# Patient Record
Sex: Male | Born: 1935 | Race: White | Hispanic: No | Smoking: Never smoker
Health system: Southern US, Community
[De-identification: ages and names within clinical notes are randomized; demographics above are authoritative.]

## PROBLEM LIST (undated history)

## (undated) DIAGNOSIS — I350 Nonrheumatic aortic (valve) stenosis: Secondary | ICD-10-CM

## (undated) DIAGNOSIS — M1990 Unspecified osteoarthritis, unspecified site: Secondary | ICD-10-CM

## (undated) DIAGNOSIS — E46 Unspecified protein-calorie malnutrition: Secondary | ICD-10-CM

## (undated) DIAGNOSIS — I779 Disorder of arteries and arterioles, unspecified: Secondary | ICD-10-CM

## (undated) DIAGNOSIS — N400 Enlarged prostate without lower urinary tract symptoms: Secondary | ICD-10-CM

## (undated) DIAGNOSIS — K449 Diaphragmatic hernia without obstruction or gangrene: Secondary | ICD-10-CM

## (undated) DIAGNOSIS — I639 Cerebral infarction, unspecified: Secondary | ICD-10-CM

## (undated) DIAGNOSIS — I272 Pulmonary hypertension, unspecified: Secondary | ICD-10-CM

## (undated) DIAGNOSIS — C8590 Non-Hodgkin lymphoma, unspecified, unspecified site: Secondary | ICD-10-CM

## (undated) DIAGNOSIS — I2510 Atherosclerotic heart disease of native coronary artery without angina pectoris: Secondary | ICD-10-CM

## (undated) DIAGNOSIS — I10 Essential (primary) hypertension: Secondary | ICD-10-CM

## (undated) DIAGNOSIS — I739 Peripheral vascular disease, unspecified: Secondary | ICD-10-CM

## (undated) HISTORY — DX: Non-Hodgkin lymphoma, unspecified, unspecified site: C85.90

## (undated) HISTORY — DX: Pulmonary hypertension, unspecified: I27.20

## (undated) HISTORY — DX: Atherosclerotic heart disease of native coronary artery without angina pectoris: I25.10

## (undated) HISTORY — DX: Cerebral infarction, unspecified: I63.9

## (undated) HISTORY — DX: Diaphragmatic hernia without obstruction or gangrene: K44.9

## (undated) HISTORY — PX: INGUINAL HERNIA REPAIR: SUR1180

## (undated) HISTORY — DX: Benign prostatic hyperplasia without lower urinary tract symptoms: N40.0

## (undated) HISTORY — DX: Nonrheumatic aortic (valve) stenosis: I35.0

## (undated) HISTORY — DX: Peripheral vascular disease, unspecified: I73.9

## (undated) HISTORY — DX: Unspecified protein-calorie malnutrition: E46

## (undated) HISTORY — PX: CATARACT EXTRACTION: SUR2

## (undated) HISTORY — DX: Essential (primary) hypertension: I10

## (undated) HISTORY — DX: Disorder of arteries and arterioles, unspecified: I77.9

## (undated) HISTORY — DX: Unspecified osteoarthritis, unspecified site: M19.90

---

## 2008-12-25 DEATH — deceased

## 2011-04-17 DIAGNOSIS — C8299 Follicular lymphoma, unspecified, extranodal and solid organ sites: Secondary | ICD-10-CM | POA: Diagnosis not present

## 2011-04-17 DIAGNOSIS — R1310 Dysphagia, unspecified: Secondary | ICD-10-CM | POA: Diagnosis not present

## 2011-04-17 DIAGNOSIS — R05 Cough: Secondary | ICD-10-CM | POA: Diagnosis not present

## 2011-08-22 ENCOUNTER — Encounter (INDEPENDENT_AMBULATORY_CARE_PROVIDER_SITE_OTHER): Payer: No Typology Code available for payment source | Admitting: Internal Medicine

## 2011-08-22 DIAGNOSIS — C8589 Other specified types of non-Hodgkin lymphoma, extranodal and solid organ sites: Secondary | ICD-10-CM

## 2011-08-22 DIAGNOSIS — R0602 Shortness of breath: Secondary | ICD-10-CM

## 2011-08-27 DIAGNOSIS — C8589 Other specified types of non-Hodgkin lymphoma, extranodal and solid organ sites: Secondary | ICD-10-CM | POA: Diagnosis not present

## 2011-08-27 DIAGNOSIS — R911 Solitary pulmonary nodule: Secondary | ICD-10-CM | POA: Diagnosis not present

## 2011-09-03 ENCOUNTER — Encounter: Payer: No Typology Code available for payment source | Admitting: Internal Medicine

## 2011-09-03 DIAGNOSIS — C8299 Follicular lymphoma, unspecified, extranodal and solid organ sites: Secondary | ICD-10-CM

## 2011-09-03 DIAGNOSIS — Z5111 Encounter for antineoplastic chemotherapy: Secondary | ICD-10-CM

## 2011-09-03 DIAGNOSIS — Z5112 Encounter for antineoplastic immunotherapy: Secondary | ICD-10-CM

## 2011-09-04 ENCOUNTER — Encounter: Payer: No Typology Code available for payment source | Admitting: Internal Medicine

## 2011-09-04 DIAGNOSIS — C8299 Follicular lymphoma, unspecified, extranodal and solid organ sites: Secondary | ICD-10-CM

## 2011-09-04 DIAGNOSIS — Z5111 Encounter for antineoplastic chemotherapy: Secondary | ICD-10-CM

## 2011-09-10 ENCOUNTER — Encounter: Payer: No Typology Code available for payment source | Admitting: Internal Medicine

## 2011-09-10 DIAGNOSIS — J479 Bronchiectasis, uncomplicated: Secondary | ICD-10-CM

## 2011-09-10 DIAGNOSIS — R0602 Shortness of breath: Secondary | ICD-10-CM

## 2011-09-10 DIAGNOSIS — C8589 Other specified types of non-Hodgkin lymphoma, extranodal and solid organ sites: Secondary | ICD-10-CM

## 2011-10-01 ENCOUNTER — Encounter: Payer: No Typology Code available for payment source | Admitting: Hematology and Oncology

## 2011-10-01 DIAGNOSIS — Z5111 Encounter for antineoplastic chemotherapy: Secondary | ICD-10-CM

## 2011-10-01 DIAGNOSIS — C8299 Follicular lymphoma, unspecified, extranodal and solid organ sites: Secondary | ICD-10-CM

## 2011-10-01 DIAGNOSIS — Z5112 Encounter for antineoplastic immunotherapy: Secondary | ICD-10-CM

## 2011-10-02 DIAGNOSIS — Z5111 Encounter for antineoplastic chemotherapy: Secondary | ICD-10-CM

## 2011-10-02 DIAGNOSIS — C8299 Follicular lymphoma, unspecified, extranodal and solid organ sites: Secondary | ICD-10-CM

## 2011-10-10 ENCOUNTER — Encounter: Payer: No Typology Code available for payment source | Admitting: Internal Medicine

## 2011-10-10 DIAGNOSIS — C8589 Other specified types of non-Hodgkin lymphoma, extranodal and solid organ sites: Secondary | ICD-10-CM

## 2011-10-10 DIAGNOSIS — M549 Dorsalgia, unspecified: Secondary | ICD-10-CM

## 2011-10-30 DIAGNOSIS — M549 Dorsalgia, unspecified: Secondary | ICD-10-CM

## 2011-10-30 DIAGNOSIS — J449 Chronic obstructive pulmonary disease, unspecified: Secondary | ICD-10-CM

## 2011-10-30 DIAGNOSIS — C8299 Follicular lymphoma, unspecified, extranodal and solid organ sites: Secondary | ICD-10-CM

## 2011-10-30 DIAGNOSIS — G589 Mononeuropathy, unspecified: Secondary | ICD-10-CM

## 2011-11-05 ENCOUNTER — Encounter: Payer: No Typology Code available for payment source | Admitting: Internal Medicine

## 2011-11-07 DIAGNOSIS — G893 Neoplasm related pain (acute) (chronic): Secondary | ICD-10-CM

## 2011-11-07 DIAGNOSIS — C8589 Other specified types of non-Hodgkin lymphoma, extranodal and solid organ sites: Secondary | ICD-10-CM

## 2011-11-18 ENCOUNTER — Encounter (INDEPENDENT_AMBULATORY_CARE_PROVIDER_SITE_OTHER): Payer: No Typology Code available for payment source | Admitting: Internal Medicine

## 2011-11-18 DIAGNOSIS — C8589 Other specified types of non-Hodgkin lymphoma, extranodal and solid organ sites: Secondary | ICD-10-CM

## 2011-11-18 DIAGNOSIS — R1310 Dysphagia, unspecified: Secondary | ICD-10-CM

## 2011-12-09 ENCOUNTER — Encounter (INDEPENDENT_AMBULATORY_CARE_PROVIDER_SITE_OTHER): Payer: No Typology Code available for payment source

## 2011-12-09 DIAGNOSIS — C8589 Other specified types of non-Hodgkin lymphoma, extranodal and solid organ sites: Secondary | ICD-10-CM

## 2011-12-09 DIAGNOSIS — Z23 Encounter for immunization: Secondary | ICD-10-CM

## 2011-12-09 DIAGNOSIS — R1310 Dysphagia, unspecified: Secondary | ICD-10-CM

## 2011-12-25 ENCOUNTER — Encounter: Payer: No Typology Code available for payment source | Admitting: Internal Medicine

## 2011-12-25 DIAGNOSIS — N400 Enlarged prostate without lower urinary tract symptoms: Secondary | ICD-10-CM

## 2011-12-25 DIAGNOSIS — R1310 Dysphagia, unspecified: Secondary | ICD-10-CM

## 2011-12-25 DIAGNOSIS — C8589 Other specified types of non-Hodgkin lymphoma, extranodal and solid organ sites: Secondary | ICD-10-CM

## 2011-12-25 DIAGNOSIS — M549 Dorsalgia, unspecified: Secondary | ICD-10-CM

## 2012-02-10 DIAGNOSIS — C8589 Other specified types of non-Hodgkin lymphoma, extranodal and solid organ sites: Secondary | ICD-10-CM

## 2012-02-10 DIAGNOSIS — R109 Unspecified abdominal pain: Secondary | ICD-10-CM

## 2012-02-10 DIAGNOSIS — M549 Dorsalgia, unspecified: Secondary | ICD-10-CM

## 2012-02-10 DIAGNOSIS — K5900 Constipation, unspecified: Secondary | ICD-10-CM

## 2012-02-23 DIAGNOSIS — C8589 Other specified types of non-Hodgkin lymphoma, extranodal and solid organ sites: Secondary | ICD-10-CM

## 2012-02-23 DIAGNOSIS — K5900 Constipation, unspecified: Secondary | ICD-10-CM

## 2012-02-26 ENCOUNTER — Encounter: Payer: No Typology Code available for payment source | Admitting: Internal Medicine

## 2012-02-26 DIAGNOSIS — G8929 Other chronic pain: Secondary | ICD-10-CM

## 2012-02-26 DIAGNOSIS — C8299 Follicular lymphoma, unspecified, extranodal and solid organ sites: Secondary | ICD-10-CM

## 2012-03-03 DIAGNOSIS — R1310 Dysphagia, unspecified: Secondary | ICD-10-CM | POA: Diagnosis not present

## 2012-03-03 DIAGNOSIS — IMO0001 Reserved for inherently not codable concepts without codable children: Secondary | ICD-10-CM | POA: Diagnosis not present

## 2012-03-03 DIAGNOSIS — K224 Dyskinesia of esophagus: Secondary | ICD-10-CM | POA: Diagnosis not present

## 2012-03-04 ENCOUNTER — Encounter: Payer: Medicare Other | Admitting: Internal Medicine

## 2012-03-04 DIAGNOSIS — Z5111 Encounter for antineoplastic chemotherapy: Secondary | ICD-10-CM | POA: Diagnosis not present

## 2012-03-04 DIAGNOSIS — C8589 Other specified types of non-Hodgkin lymphoma, extranodal and solid organ sites: Secondary | ICD-10-CM | POA: Diagnosis not present

## 2012-03-04 DIAGNOSIS — Z5112 Encounter for antineoplastic immunotherapy: Secondary | ICD-10-CM | POA: Diagnosis not present

## 2012-03-04 DIAGNOSIS — C8299 Follicular lymphoma, unspecified, extranodal and solid organ sites: Secondary | ICD-10-CM | POA: Diagnosis not present

## 2012-03-04 DIAGNOSIS — G893 Neoplasm related pain (acute) (chronic): Secondary | ICD-10-CM | POA: Diagnosis not present

## 2012-03-04 DIAGNOSIS — Z79899 Other long term (current) drug therapy: Secondary | ICD-10-CM | POA: Diagnosis not present

## 2012-03-05 DIAGNOSIS — Z5111 Encounter for antineoplastic chemotherapy: Secondary | ICD-10-CM

## 2012-03-05 DIAGNOSIS — C8299 Follicular lymphoma, unspecified, extranodal and solid organ sites: Secondary | ICD-10-CM | POA: Diagnosis not present

## 2012-03-12 DIAGNOSIS — R109 Unspecified abdominal pain: Secondary | ICD-10-CM

## 2012-03-12 DIAGNOSIS — Z79899 Other long term (current) drug therapy: Secondary | ICD-10-CM | POA: Diagnosis not present

## 2012-03-12 DIAGNOSIS — D6481 Anemia due to antineoplastic chemotherapy: Secondary | ICD-10-CM

## 2012-03-12 DIAGNOSIS — C8299 Follicular lymphoma, unspecified, extranodal and solid organ sites: Secondary | ICD-10-CM | POA: Diagnosis not present

## 2012-03-12 DIAGNOSIS — D702 Other drug-induced agranulocytosis: Secondary | ICD-10-CM

## 2012-03-12 DIAGNOSIS — T451X5A Adverse effect of antineoplastic and immunosuppressive drugs, initial encounter: Secondary | ICD-10-CM

## 2012-04-01 DIAGNOSIS — C8299 Follicular lymphoma, unspecified, extranodal and solid organ sites: Secondary | ICD-10-CM

## 2012-04-01 DIAGNOSIS — Z5112 Encounter for antineoplastic immunotherapy: Secondary | ICD-10-CM

## 2012-04-01 DIAGNOSIS — R109 Unspecified abdominal pain: Secondary | ICD-10-CM | POA: Diagnosis not present

## 2012-04-01 DIAGNOSIS — Z5111 Encounter for antineoplastic chemotherapy: Secondary | ICD-10-CM

## 2012-04-01 DIAGNOSIS — K4090 Unilateral inguinal hernia, without obstruction or gangrene, not specified as recurrent: Secondary | ICD-10-CM | POA: Diagnosis not present

## 2012-04-02 DIAGNOSIS — C8299 Follicular lymphoma, unspecified, extranodal and solid organ sites: Secondary | ICD-10-CM

## 2012-04-02 DIAGNOSIS — Z5111 Encounter for antineoplastic chemotherapy: Secondary | ICD-10-CM

## 2012-04-05 DIAGNOSIS — C8589 Other specified types of non-Hodgkin lymphoma, extranodal and solid organ sites: Secondary | ICD-10-CM

## 2012-04-07 ENCOUNTER — Encounter: Payer: No Typology Code available for payment source | Admitting: Internal Medicine

## 2012-04-07 DIAGNOSIS — R109 Unspecified abdominal pain: Secondary | ICD-10-CM | POA: Diagnosis not present

## 2012-04-07 DIAGNOSIS — D709 Neutropenia, unspecified: Secondary | ICD-10-CM | POA: Diagnosis not present

## 2012-04-07 DIAGNOSIS — D696 Thrombocytopenia, unspecified: Secondary | ICD-10-CM | POA: Diagnosis not present

## 2012-04-07 DIAGNOSIS — C8299 Follicular lymphoma, unspecified, extranodal and solid organ sites: Secondary | ICD-10-CM | POA: Diagnosis not present

## 2012-04-07 DIAGNOSIS — D649 Anemia, unspecified: Secondary | ICD-10-CM | POA: Diagnosis not present

## 2012-04-07 DIAGNOSIS — K4090 Unilateral inguinal hernia, without obstruction or gangrene, not specified as recurrent: Secondary | ICD-10-CM | POA: Diagnosis not present

## 2012-04-12 DIAGNOSIS — J90 Pleural effusion, not elsewhere classified: Secondary | ICD-10-CM | POA: Diagnosis not present

## 2012-04-12 DIAGNOSIS — D7389 Other diseases of spleen: Secondary | ICD-10-CM | POA: Diagnosis not present

## 2012-04-12 DIAGNOSIS — R5381 Other malaise: Secondary | ICD-10-CM | POA: Diagnosis not present

## 2012-04-12 DIAGNOSIS — K8689 Other specified diseases of pancreas: Secondary | ICD-10-CM | POA: Diagnosis not present

## 2012-04-12 DIAGNOSIS — M545 Low back pain, unspecified: Secondary | ICD-10-CM | POA: Diagnosis not present

## 2012-04-12 DIAGNOSIS — R0989 Other specified symptoms and signs involving the circulatory and respiratory systems: Secondary | ICD-10-CM | POA: Diagnosis not present

## 2012-04-12 DIAGNOSIS — R0602 Shortness of breath: Secondary | ICD-10-CM | POA: Diagnosis not present

## 2012-04-12 DIAGNOSIS — J3489 Other specified disorders of nose and nasal sinuses: Secondary | ICD-10-CM | POA: Diagnosis not present

## 2012-04-12 DIAGNOSIS — Z85028 Personal history of other malignant neoplasm of stomach: Secondary | ICD-10-CM | POA: Diagnosis not present

## 2012-04-14 DIAGNOSIS — C8299 Follicular lymphoma, unspecified, extranodal and solid organ sites: Secondary | ICD-10-CM | POA: Diagnosis not present

## 2012-04-14 DIAGNOSIS — R109 Unspecified abdominal pain: Secondary | ICD-10-CM | POA: Diagnosis not present

## 2012-04-16 DIAGNOSIS — G8929 Other chronic pain: Secondary | ICD-10-CM | POA: Diagnosis not present

## 2012-04-16 DIAGNOSIS — M545 Low back pain: Secondary | ICD-10-CM | POA: Diagnosis not present

## 2012-04-19 DIAGNOSIS — Z79899 Other long term (current) drug therapy: Secondary | ICD-10-CM | POA: Diagnosis not present

## 2012-04-19 DIAGNOSIS — R109 Unspecified abdominal pain: Secondary | ICD-10-CM | POA: Diagnosis not present

## 2012-04-20 DIAGNOSIS — R1310 Dysphagia, unspecified: Secondary | ICD-10-CM | POA: Diagnosis not present

## 2012-04-20 DIAGNOSIS — G8929 Other chronic pain: Secondary | ICD-10-CM | POA: Diagnosis not present

## 2012-04-21 DIAGNOSIS — R1310 Dysphagia, unspecified: Secondary | ICD-10-CM | POA: Diagnosis not present

## 2012-04-22 DIAGNOSIS — R0602 Shortness of breath: Secondary | ICD-10-CM | POA: Diagnosis not present

## 2012-04-22 DIAGNOSIS — F17200 Nicotine dependence, unspecified, uncomplicated: Secondary | ICD-10-CM | POA: Diagnosis not present

## 2012-04-22 DIAGNOSIS — R0609 Other forms of dyspnea: Secondary | ICD-10-CM | POA: Diagnosis not present

## 2012-04-22 DIAGNOSIS — R209 Unspecified disturbances of skin sensation: Secondary | ICD-10-CM | POA: Diagnosis not present

## 2012-04-22 DIAGNOSIS — H538 Other visual disturbances: Secondary | ICD-10-CM | POA: Diagnosis not present

## 2012-04-22 DIAGNOSIS — Z79899 Other long term (current) drug therapy: Secondary | ICD-10-CM | POA: Diagnosis not present

## 2012-04-22 DIAGNOSIS — Z85028 Personal history of other malignant neoplasm of stomach: Secondary | ICD-10-CM | POA: Diagnosis not present

## 2012-04-22 DIAGNOSIS — Z9221 Personal history of antineoplastic chemotherapy: Secondary | ICD-10-CM | POA: Diagnosis not present

## 2012-04-22 DIAGNOSIS — R42 Dizziness and giddiness: Secondary | ICD-10-CM | POA: Diagnosis not present

## 2012-04-22 DIAGNOSIS — R059 Cough, unspecified: Secondary | ICD-10-CM | POA: Diagnosis not present

## 2012-04-22 DIAGNOSIS — Z923 Personal history of irradiation: Secondary | ICD-10-CM | POA: Diagnosis not present

## 2012-04-23 DIAGNOSIS — I10 Essential (primary) hypertension: Secondary | ICD-10-CM | POA: Diagnosis not present

## 2012-04-23 DIAGNOSIS — I6529 Occlusion and stenosis of unspecified carotid artery: Secondary | ICD-10-CM | POA: Diagnosis not present

## 2012-04-23 DIAGNOSIS — I658 Occlusion and stenosis of other precerebral arteries: Secondary | ICD-10-CM | POA: Diagnosis not present

## 2012-04-23 DIAGNOSIS — R42 Dizziness and giddiness: Secondary | ICD-10-CM | POA: Diagnosis not present

## 2012-04-23 DIAGNOSIS — R1310 Dysphagia, unspecified: Secondary | ICD-10-CM | POA: Diagnosis not present

## 2012-04-23 DIAGNOSIS — R0602 Shortness of breath: Secondary | ICD-10-CM | POA: Diagnosis not present

## 2012-04-26 DIAGNOSIS — I2510 Atherosclerotic heart disease of native coronary artery without angina pectoris: Secondary | ICD-10-CM | POA: Diagnosis not present

## 2012-04-26 DIAGNOSIS — R109 Unspecified abdominal pain: Secondary | ICD-10-CM | POA: Diagnosis not present

## 2012-04-26 DIAGNOSIS — G609 Hereditary and idiopathic neuropathy, unspecified: Secondary | ICD-10-CM | POA: Diagnosis not present

## 2012-04-26 DIAGNOSIS — C8588 Other specified types of non-Hodgkin lymphoma, lymph nodes of multiple sites: Secondary | ICD-10-CM | POA: Diagnosis not present

## 2012-04-26 DIAGNOSIS — M549 Dorsalgia, unspecified: Secondary | ICD-10-CM | POA: Diagnosis not present

## 2012-04-26 DIAGNOSIS — G8929 Other chronic pain: Secondary | ICD-10-CM | POA: Diagnosis not present

## 2012-04-28 ENCOUNTER — Encounter: Payer: No Typology Code available for payment source | Admitting: Internal Medicine

## 2012-04-28 DIAGNOSIS — K209 Esophagitis, unspecified: Secondary | ICD-10-CM

## 2012-04-28 DIAGNOSIS — I252 Old myocardial infarction: Secondary | ICD-10-CM | POA: Diagnosis not present

## 2012-04-28 DIAGNOSIS — C8299 Follicular lymphoma, unspecified, extranodal and solid organ sites: Secondary | ICD-10-CM

## 2012-04-28 DIAGNOSIS — I6789 Other cerebrovascular disease: Secondary | ICD-10-CM | POA: Diagnosis not present

## 2012-04-28 DIAGNOSIS — M549 Dorsalgia, unspecified: Secondary | ICD-10-CM | POA: Diagnosis not present

## 2012-04-28 DIAGNOSIS — G8929 Other chronic pain: Secondary | ICD-10-CM

## 2012-04-28 DIAGNOSIS — C8589 Other specified types of non-Hodgkin lymphoma, extranodal and solid organ sites: Secondary | ICD-10-CM | POA: Diagnosis not present

## 2012-04-28 DIAGNOSIS — IMO0002 Reserved for concepts with insufficient information to code with codable children: Secondary | ICD-10-CM | POA: Diagnosis not present

## 2012-04-29 DIAGNOSIS — I2510 Atherosclerotic heart disease of native coronary artery without angina pectoris: Secondary | ICD-10-CM | POA: Diagnosis not present

## 2012-04-29 DIAGNOSIS — G8929 Other chronic pain: Secondary | ICD-10-CM | POA: Diagnosis not present

## 2012-04-29 DIAGNOSIS — R109 Unspecified abdominal pain: Secondary | ICD-10-CM | POA: Diagnosis not present

## 2012-04-29 DIAGNOSIS — C8588 Other specified types of non-Hodgkin lymphoma, lymph nodes of multiple sites: Secondary | ICD-10-CM | POA: Diagnosis not present

## 2012-04-29 DIAGNOSIS — G609 Hereditary and idiopathic neuropathy, unspecified: Secondary | ICD-10-CM | POA: Diagnosis not present

## 2012-04-29 DIAGNOSIS — M549 Dorsalgia, unspecified: Secondary | ICD-10-CM | POA: Diagnosis not present

## 2012-05-01 DIAGNOSIS — K222 Esophageal obstruction: Secondary | ICD-10-CM | POA: Diagnosis not present

## 2012-05-01 DIAGNOSIS — Z9221 Personal history of antineoplastic chemotherapy: Secondary | ICD-10-CM | POA: Diagnosis not present

## 2012-05-01 DIAGNOSIS — Z79899 Other long term (current) drug therapy: Secondary | ICD-10-CM | POA: Diagnosis not present

## 2012-05-01 DIAGNOSIS — R0602 Shortness of breath: Secondary | ICD-10-CM | POA: Diagnosis not present

## 2012-05-01 DIAGNOSIS — Z87898 Personal history of other specified conditions: Secondary | ICD-10-CM | POA: Diagnosis not present

## 2012-05-01 DIAGNOSIS — Z85028 Personal history of other malignant neoplasm of stomach: Secondary | ICD-10-CM | POA: Diagnosis not present

## 2012-05-01 DIAGNOSIS — R1310 Dysphagia, unspecified: Secondary | ICD-10-CM | POA: Diagnosis not present

## 2012-05-03 DIAGNOSIS — G8929 Other chronic pain: Secondary | ICD-10-CM | POA: Diagnosis not present

## 2012-05-03 DIAGNOSIS — M549 Dorsalgia, unspecified: Secondary | ICD-10-CM | POA: Diagnosis not present

## 2012-05-03 DIAGNOSIS — G609 Hereditary and idiopathic neuropathy, unspecified: Secondary | ICD-10-CM | POA: Diagnosis not present

## 2012-05-03 DIAGNOSIS — C8588 Other specified types of non-Hodgkin lymphoma, lymph nodes of multiple sites: Secondary | ICD-10-CM | POA: Diagnosis not present

## 2012-05-03 DIAGNOSIS — R109 Unspecified abdominal pain: Secondary | ICD-10-CM | POA: Diagnosis not present

## 2012-05-03 DIAGNOSIS — I2510 Atherosclerotic heart disease of native coronary artery without angina pectoris: Secondary | ICD-10-CM | POA: Diagnosis not present

## 2012-05-05 ENCOUNTER — Encounter: Payer: Self-pay | Admitting: Physician Assistant

## 2012-05-05 DIAGNOSIS — E871 Hypo-osmolality and hyponatremia: Secondary | ICD-10-CM | POA: Diagnosis not present

## 2012-05-05 DIAGNOSIS — E41 Nutritional marasmus: Secondary | ICD-10-CM | POA: Diagnosis not present

## 2012-05-05 DIAGNOSIS — F17200 Nicotine dependence, unspecified, uncomplicated: Secondary | ICD-10-CM | POA: Diagnosis present

## 2012-05-05 DIAGNOSIS — M545 Low back pain, unspecified: Secondary | ICD-10-CM | POA: Diagnosis not present

## 2012-05-05 DIAGNOSIS — Z79899 Other long term (current) drug therapy: Secondary | ICD-10-CM | POA: Diagnosis not present

## 2012-05-05 DIAGNOSIS — N400 Enlarged prostate without lower urinary tract symptoms: Secondary | ICD-10-CM | POA: Diagnosis present

## 2012-05-05 DIAGNOSIS — Z791 Long term (current) use of non-steroidal anti-inflammatories (NSAID): Secondary | ICD-10-CM | POA: Diagnosis not present

## 2012-05-05 DIAGNOSIS — J449 Chronic obstructive pulmonary disease, unspecified: Secondary | ICD-10-CM | POA: Diagnosis present

## 2012-05-05 DIAGNOSIS — D630 Anemia in neoplastic disease: Secondary | ICD-10-CM | POA: Diagnosis not present

## 2012-05-05 DIAGNOSIS — C8299 Follicular lymphoma, unspecified, extranodal and solid organ sites: Secondary | ICD-10-CM | POA: Diagnosis present

## 2012-05-05 DIAGNOSIS — I2510 Atherosclerotic heart disease of native coronary artery without angina pectoris: Secondary | ICD-10-CM | POA: Diagnosis not present

## 2012-05-05 DIAGNOSIS — R0602 Shortness of breath: Secondary | ICD-10-CM | POA: Diagnosis not present

## 2012-05-05 DIAGNOSIS — G8929 Other chronic pain: Secondary | ICD-10-CM | POA: Diagnosis not present

## 2012-05-05 DIAGNOSIS — IMO0002 Reserved for concepts with insufficient information to code with codable children: Secondary | ICD-10-CM | POA: Diagnosis not present

## 2012-05-05 DIAGNOSIS — F411 Generalized anxiety disorder: Secondary | ICD-10-CM | POA: Diagnosis not present

## 2012-05-05 DIAGNOSIS — F0390 Unspecified dementia without behavioral disturbance: Secondary | ICD-10-CM | POA: Diagnosis present

## 2012-05-05 DIAGNOSIS — J9819 Other pulmonary collapse: Secondary | ICD-10-CM | POA: Diagnosis not present

## 2012-05-05 DIAGNOSIS — K449 Diaphragmatic hernia without obstruction or gangrene: Secondary | ICD-10-CM | POA: Diagnosis present

## 2012-05-05 DIAGNOSIS — M1990 Unspecified osteoarthritis, unspecified site: Secondary | ICD-10-CM | POA: Diagnosis present

## 2012-05-05 DIAGNOSIS — Z8673 Personal history of transient ischemic attack (TIA), and cerebral infarction without residual deficits: Secondary | ICD-10-CM | POA: Diagnosis not present

## 2012-05-07 DIAGNOSIS — C8299 Follicular lymphoma, unspecified, extranodal and solid organ sites: Secondary | ICD-10-CM

## 2012-05-10 DIAGNOSIS — C8588 Other specified types of non-Hodgkin lymphoma, lymph nodes of multiple sites: Secondary | ICD-10-CM | POA: Diagnosis not present

## 2012-05-10 DIAGNOSIS — G8929 Other chronic pain: Secondary | ICD-10-CM | POA: Diagnosis not present

## 2012-05-10 DIAGNOSIS — M549 Dorsalgia, unspecified: Secondary | ICD-10-CM | POA: Diagnosis not present

## 2012-05-10 DIAGNOSIS — R109 Unspecified abdominal pain: Secondary | ICD-10-CM | POA: Diagnosis not present

## 2012-05-10 DIAGNOSIS — I2510 Atherosclerotic heart disease of native coronary artery without angina pectoris: Secondary | ICD-10-CM | POA: Diagnosis not present

## 2012-05-13 DIAGNOSIS — G8929 Other chronic pain: Secondary | ICD-10-CM | POA: Diagnosis not present

## 2012-05-13 DIAGNOSIS — M549 Dorsalgia, unspecified: Secondary | ICD-10-CM | POA: Diagnosis not present

## 2012-05-13 DIAGNOSIS — R109 Unspecified abdominal pain: Secondary | ICD-10-CM | POA: Diagnosis not present

## 2012-05-13 DIAGNOSIS — G609 Hereditary and idiopathic neuropathy, unspecified: Secondary | ICD-10-CM | POA: Diagnosis not present

## 2012-05-13 DIAGNOSIS — C8588 Other specified types of non-Hodgkin lymphoma, lymph nodes of multiple sites: Secondary | ICD-10-CM | POA: Diagnosis not present

## 2012-05-13 DIAGNOSIS — I2510 Atherosclerotic heart disease of native coronary artery without angina pectoris: Secondary | ICD-10-CM | POA: Diagnosis not present

## 2012-05-17 DIAGNOSIS — E871 Hypo-osmolality and hyponatremia: Secondary | ICD-10-CM | POA: Diagnosis not present

## 2012-05-17 DIAGNOSIS — Z79899 Other long term (current) drug therapy: Secondary | ICD-10-CM | POA: Diagnosis not present

## 2012-05-17 DIAGNOSIS — F17200 Nicotine dependence, unspecified, uncomplicated: Secondary | ICD-10-CM | POA: Diagnosis not present

## 2012-05-17 DIAGNOSIS — F0390 Unspecified dementia without behavioral disturbance: Secondary | ICD-10-CM | POA: Diagnosis not present

## 2012-05-17 DIAGNOSIS — Z85028 Personal history of other malignant neoplasm of stomach: Secondary | ICD-10-CM | POA: Diagnosis not present

## 2012-05-17 DIAGNOSIS — M79609 Pain in unspecified limb: Secondary | ICD-10-CM | POA: Diagnosis not present

## 2012-05-17 DIAGNOSIS — I252 Old myocardial infarction: Secondary | ICD-10-CM | POA: Diagnosis not present

## 2012-05-17 DIAGNOSIS — F411 Generalized anxiety disorder: Secondary | ICD-10-CM | POA: Diagnosis not present

## 2012-05-17 DIAGNOSIS — Z8673 Personal history of transient ischemic attack (TIA), and cerebral infarction without residual deficits: Secondary | ICD-10-CM | POA: Diagnosis not present

## 2012-05-17 DIAGNOSIS — Z9221 Personal history of antineoplastic chemotherapy: Secondary | ICD-10-CM | POA: Diagnosis not present

## 2012-05-20 DIAGNOSIS — E871 Hypo-osmolality and hyponatremia: Secondary | ICD-10-CM | POA: Diagnosis not present

## 2012-05-21 DIAGNOSIS — C8588 Other specified types of non-Hodgkin lymphoma, lymph nodes of multiple sites: Secondary | ICD-10-CM | POA: Diagnosis not present

## 2012-05-21 DIAGNOSIS — G8929 Other chronic pain: Secondary | ICD-10-CM | POA: Diagnosis not present

## 2012-05-21 DIAGNOSIS — M549 Dorsalgia, unspecified: Secondary | ICD-10-CM | POA: Diagnosis not present

## 2012-05-21 DIAGNOSIS — G609 Hereditary and idiopathic neuropathy, unspecified: Secondary | ICD-10-CM | POA: Diagnosis not present

## 2012-05-21 DIAGNOSIS — R109 Unspecified abdominal pain: Secondary | ICD-10-CM | POA: Diagnosis not present

## 2012-05-21 DIAGNOSIS — I2510 Atherosclerotic heart disease of native coronary artery without angina pectoris: Secondary | ICD-10-CM | POA: Diagnosis not present

## 2012-05-25 DIAGNOSIS — R1310 Dysphagia, unspecified: Secondary | ICD-10-CM | POA: Diagnosis not present

## 2012-05-25 DIAGNOSIS — M549 Dorsalgia, unspecified: Secondary | ICD-10-CM | POA: Diagnosis not present

## 2012-05-25 DIAGNOSIS — G8929 Other chronic pain: Secondary | ICD-10-CM | POA: Diagnosis not present

## 2012-05-25 DIAGNOSIS — C8588 Other specified types of non-Hodgkin lymphoma, lymph nodes of multiple sites: Secondary | ICD-10-CM | POA: Diagnosis not present

## 2012-05-25 DIAGNOSIS — R109 Unspecified abdominal pain: Secondary | ICD-10-CM | POA: Diagnosis not present

## 2012-05-25 DIAGNOSIS — G609 Hereditary and idiopathic neuropathy, unspecified: Secondary | ICD-10-CM | POA: Diagnosis not present

## 2012-05-25 DIAGNOSIS — I2510 Atherosclerotic heart disease of native coronary artery without angina pectoris: Secondary | ICD-10-CM | POA: Diagnosis not present

## 2012-05-28 DIAGNOSIS — Z79899 Other long term (current) drug therapy: Secondary | ICD-10-CM | POA: Diagnosis not present

## 2012-05-28 DIAGNOSIS — M549 Dorsalgia, unspecified: Secondary | ICD-10-CM | POA: Diagnosis not present

## 2012-05-28 DIAGNOSIS — C8588 Other specified types of non-Hodgkin lymphoma, lymph nodes of multiple sites: Secondary | ICD-10-CM | POA: Diagnosis not present

## 2012-05-28 DIAGNOSIS — K449 Diaphragmatic hernia without obstruction or gangrene: Secondary | ICD-10-CM | POA: Diagnosis not present

## 2012-05-28 DIAGNOSIS — K222 Esophageal obstruction: Secondary | ICD-10-CM | POA: Diagnosis not present

## 2012-05-28 DIAGNOSIS — G8929 Other chronic pain: Secondary | ICD-10-CM | POA: Diagnosis not present

## 2012-05-28 DIAGNOSIS — I2510 Atherosclerotic heart disease of native coronary artery without angina pectoris: Secondary | ICD-10-CM | POA: Diagnosis not present

## 2012-05-28 DIAGNOSIS — F17200 Nicotine dependence, unspecified, uncomplicated: Secondary | ICD-10-CM | POA: Diagnosis not present

## 2012-05-28 DIAGNOSIS — C8293 Follicular lymphoma, unspecified, intra-abdominal lymph nodes: Secondary | ICD-10-CM | POA: Diagnosis not present

## 2012-05-28 DIAGNOSIS — G609 Hereditary and idiopathic neuropathy, unspecified: Secondary | ICD-10-CM | POA: Diagnosis not present

## 2012-05-28 DIAGNOSIS — F411 Generalized anxiety disorder: Secondary | ICD-10-CM | POA: Diagnosis not present

## 2012-05-28 DIAGNOSIS — I252 Old myocardial infarction: Secondary | ICD-10-CM | POA: Diagnosis not present

## 2012-05-28 DIAGNOSIS — R109 Unspecified abdominal pain: Secondary | ICD-10-CM | POA: Diagnosis not present

## 2012-05-28 DIAGNOSIS — Z8673 Personal history of transient ischemic attack (TIA), and cerebral infarction without residual deficits: Secondary | ICD-10-CM | POA: Diagnosis not present

## 2012-05-28 DIAGNOSIS — G589 Mononeuropathy, unspecified: Secondary | ICD-10-CM | POA: Diagnosis not present

## 2012-05-28 DIAGNOSIS — R1310 Dysphagia, unspecified: Secondary | ICD-10-CM | POA: Diagnosis not present

## 2012-05-28 DIAGNOSIS — Z800 Family history of malignant neoplasm of digestive organs: Secondary | ICD-10-CM | POA: Diagnosis not present

## 2012-06-03 DIAGNOSIS — M549 Dorsalgia, unspecified: Secondary | ICD-10-CM | POA: Diagnosis not present

## 2012-06-03 DIAGNOSIS — I2510 Atherosclerotic heart disease of native coronary artery without angina pectoris: Secondary | ICD-10-CM | POA: Diagnosis not present

## 2012-06-03 DIAGNOSIS — C8588 Other specified types of non-Hodgkin lymphoma, lymph nodes of multiple sites: Secondary | ICD-10-CM | POA: Diagnosis not present

## 2012-06-03 DIAGNOSIS — G8929 Other chronic pain: Secondary | ICD-10-CM | POA: Diagnosis not present

## 2012-06-03 DIAGNOSIS — G609 Hereditary and idiopathic neuropathy, unspecified: Secondary | ICD-10-CM | POA: Diagnosis not present

## 2012-06-03 DIAGNOSIS — R109 Unspecified abdominal pain: Secondary | ICD-10-CM | POA: Diagnosis not present

## 2012-06-07 DIAGNOSIS — R0602 Shortness of breath: Secondary | ICD-10-CM | POA: Diagnosis not present

## 2012-06-07 DIAGNOSIS — C8589 Other specified types of non-Hodgkin lymphoma, extranodal and solid organ sites: Secondary | ICD-10-CM | POA: Diagnosis not present

## 2012-06-07 DIAGNOSIS — G609 Hereditary and idiopathic neuropathy, unspecified: Secondary | ICD-10-CM | POA: Diagnosis not present

## 2012-06-07 DIAGNOSIS — Z923 Personal history of irradiation: Secondary | ICD-10-CM | POA: Diagnosis not present

## 2012-06-07 DIAGNOSIS — Z9221 Personal history of antineoplastic chemotherapy: Secondary | ICD-10-CM | POA: Diagnosis not present

## 2012-06-07 DIAGNOSIS — J438 Other emphysema: Secondary | ICD-10-CM | POA: Diagnosis not present

## 2012-06-07 DIAGNOSIS — I252 Old myocardial infarction: Secondary | ICD-10-CM | POA: Diagnosis not present

## 2012-06-07 DIAGNOSIS — F17200 Nicotine dependence, unspecified, uncomplicated: Secondary | ICD-10-CM | POA: Diagnosis not present

## 2012-06-07 DIAGNOSIS — Z79899 Other long term (current) drug therapy: Secondary | ICD-10-CM | POA: Diagnosis not present

## 2012-06-07 DIAGNOSIS — R109 Unspecified abdominal pain: Secondary | ICD-10-CM | POA: Diagnosis not present

## 2012-06-07 DIAGNOSIS — Z8673 Personal history of transient ischemic attack (TIA), and cerebral infarction without residual deficits: Secondary | ICD-10-CM | POA: Diagnosis not present

## 2012-06-10 DIAGNOSIS — R1310 Dysphagia, unspecified: Secondary | ICD-10-CM | POA: Diagnosis not present

## 2012-06-14 DIAGNOSIS — G909 Disorder of the autonomic nervous system, unspecified: Secondary | ICD-10-CM | POA: Diagnosis not present

## 2012-06-14 DIAGNOSIS — K219 Gastro-esophageal reflux disease without esophagitis: Secondary | ICD-10-CM | POA: Diagnosis not present

## 2012-06-15 ENCOUNTER — Encounter: Payer: Self-pay | Admitting: Physician Assistant

## 2012-06-15 DIAGNOSIS — M6281 Muscle weakness (generalized): Secondary | ICD-10-CM | POA: Diagnosis not present

## 2012-06-15 DIAGNOSIS — R1310 Dysphagia, unspecified: Secondary | ICD-10-CM | POA: Diagnosis not present

## 2012-06-15 DIAGNOSIS — R5381 Other malaise: Secondary | ICD-10-CM | POA: Diagnosis not present

## 2012-06-15 DIAGNOSIS — S298XXA Other specified injuries of thorax, initial encounter: Secondary | ICD-10-CM | POA: Diagnosis not present

## 2012-06-15 DIAGNOSIS — Z8673 Personal history of transient ischemic attack (TIA), and cerebral infarction without residual deficits: Secondary | ICD-10-CM | POA: Diagnosis not present

## 2012-06-15 DIAGNOSIS — M549 Dorsalgia, unspecified: Secondary | ICD-10-CM | POA: Diagnosis not present

## 2012-06-15 DIAGNOSIS — I2789 Other specified pulmonary heart diseases: Secondary | ICD-10-CM | POA: Diagnosis present

## 2012-06-15 DIAGNOSIS — D649 Anemia, unspecified: Secondary | ICD-10-CM | POA: Diagnosis not present

## 2012-06-15 DIAGNOSIS — I2510 Atherosclerotic heart disease of native coronary artery without angina pectoris: Secondary | ICD-10-CM | POA: Diagnosis present

## 2012-06-15 DIAGNOSIS — I359 Nonrheumatic aortic valve disorder, unspecified: Secondary | ICD-10-CM | POA: Diagnosis present

## 2012-06-15 DIAGNOSIS — M1990 Unspecified osteoarthritis, unspecified site: Secondary | ICD-10-CM | POA: Diagnosis present

## 2012-06-15 DIAGNOSIS — Z01810 Encounter for preprocedural cardiovascular examination: Secondary | ICD-10-CM | POA: Diagnosis not present

## 2012-06-15 DIAGNOSIS — S72143A Displaced intertrochanteric fracture of unspecified femur, initial encounter for closed fracture: Secondary | ICD-10-CM | POA: Diagnosis not present

## 2012-06-15 DIAGNOSIS — S72009D Fracture of unspecified part of neck of unspecified femur, subsequent encounter for closed fracture with routine healing: Secondary | ICD-10-CM | POA: Diagnosis not present

## 2012-06-15 DIAGNOSIS — N400 Enlarged prostate without lower urinary tract symptoms: Secondary | ICD-10-CM | POA: Diagnosis not present

## 2012-06-15 DIAGNOSIS — S8990XA Unspecified injury of unspecified lower leg, initial encounter: Secondary | ICD-10-CM | POA: Diagnosis not present

## 2012-06-15 DIAGNOSIS — Z79899 Other long term (current) drug therapy: Secondary | ICD-10-CM | POA: Diagnosis not present

## 2012-06-15 DIAGNOSIS — W19XXXA Unspecified fall, initial encounter: Secondary | ICD-10-CM | POA: Diagnosis not present

## 2012-06-15 DIAGNOSIS — I252 Old myocardial infarction: Secondary | ICD-10-CM | POA: Diagnosis not present

## 2012-06-15 DIAGNOSIS — M159 Polyosteoarthritis, unspecified: Secondary | ICD-10-CM | POA: Diagnosis not present

## 2012-06-15 DIAGNOSIS — C801 Malignant (primary) neoplasm, unspecified: Secondary | ICD-10-CM | POA: Diagnosis not present

## 2012-06-15 DIAGNOSIS — Y92009 Unspecified place in unspecified non-institutional (private) residence as the place of occurrence of the external cause: Secondary | ICD-10-CM | POA: Diagnosis not present

## 2012-06-15 DIAGNOSIS — I10 Essential (primary) hypertension: Secondary | ICD-10-CM | POA: Diagnosis not present

## 2012-06-15 DIAGNOSIS — IMO0002 Reserved for concepts with insufficient information to code with codable children: Secondary | ICD-10-CM | POA: Diagnosis not present

## 2012-06-15 DIAGNOSIS — S99919A Unspecified injury of unspecified ankle, initial encounter: Secondary | ICD-10-CM | POA: Diagnosis not present

## 2012-06-15 DIAGNOSIS — S0990XA Unspecified injury of head, initial encounter: Secondary | ICD-10-CM | POA: Diagnosis not present

## 2012-06-15 DIAGNOSIS — C8589 Other specified types of non-Hodgkin lymphoma, extranodal and solid organ sites: Secondary | ICD-10-CM | POA: Diagnosis not present

## 2012-06-15 DIAGNOSIS — Y999 Unspecified external cause status: Secondary | ICD-10-CM | POA: Diagnosis not present

## 2012-06-15 DIAGNOSIS — M25559 Pain in unspecified hip: Secondary | ICD-10-CM | POA: Diagnosis not present

## 2012-06-15 DIAGNOSIS — S0993XA Unspecified injury of face, initial encounter: Secondary | ICD-10-CM | POA: Diagnosis not present

## 2012-06-15 DIAGNOSIS — S72009A Fracture of unspecified part of neck of unspecified femur, initial encounter for closed fracture: Secondary | ICD-10-CM | POA: Diagnosis not present

## 2012-06-15 DIAGNOSIS — K449 Diaphragmatic hernia without obstruction or gangrene: Secondary | ICD-10-CM | POA: Diagnosis present

## 2012-06-15 DIAGNOSIS — E440 Moderate protein-calorie malnutrition: Secondary | ICD-10-CM | POA: Diagnosis not present

## 2012-06-15 DIAGNOSIS — R262 Difficulty in walking, not elsewhere classified: Secondary | ICD-10-CM | POA: Diagnosis not present

## 2012-06-16 ENCOUNTER — Encounter: Payer: Self-pay | Admitting: Physician Assistant

## 2012-06-16 ENCOUNTER — Encounter: Payer: Self-pay | Admitting: Cardiology

## 2012-06-16 DIAGNOSIS — Z01810 Encounter for preprocedural cardiovascular examination: Secondary | ICD-10-CM

## 2012-06-17 DIAGNOSIS — I359 Nonrheumatic aortic valve disorder, unspecified: Secondary | ICD-10-CM

## 2012-06-19 DIAGNOSIS — S72009A Fracture of unspecified part of neck of unspecified femur, initial encounter for closed fracture: Secondary | ICD-10-CM | POA: Diagnosis not present

## 2012-06-19 DIAGNOSIS — K219 Gastro-esophageal reflux disease without esophagitis: Secondary | ICD-10-CM | POA: Diagnosis not present

## 2012-06-19 DIAGNOSIS — S72143A Displaced intertrochanteric fracture of unspecified femur, initial encounter for closed fracture: Secondary | ICD-10-CM | POA: Diagnosis not present

## 2012-06-19 DIAGNOSIS — Z0000 Encounter for general adult medical examination without abnormal findings: Secondary | ICD-10-CM | POA: Diagnosis not present

## 2012-06-19 DIAGNOSIS — I2789 Other specified pulmonary heart diseases: Secondary | ICD-10-CM | POA: Diagnosis not present

## 2012-06-19 DIAGNOSIS — I10 Essential (primary) hypertension: Secondary | ICD-10-CM | POA: Diagnosis not present

## 2012-06-19 DIAGNOSIS — M159 Polyosteoarthritis, unspecified: Secondary | ICD-10-CM | POA: Diagnosis not present

## 2012-06-19 DIAGNOSIS — C801 Malignant (primary) neoplasm, unspecified: Secondary | ICD-10-CM | POA: Diagnosis not present

## 2012-06-19 DIAGNOSIS — R262 Difficulty in walking, not elsewhere classified: Secondary | ICD-10-CM | POA: Diagnosis not present

## 2012-06-19 DIAGNOSIS — M25559 Pain in unspecified hip: Secondary | ICD-10-CM | POA: Diagnosis not present

## 2012-06-19 DIAGNOSIS — I779 Disorder of arteries and arterioles, unspecified: Secondary | ICD-10-CM | POA: Diagnosis not present

## 2012-06-19 DIAGNOSIS — W19XXXA Unspecified fall, initial encounter: Secondary | ICD-10-CM | POA: Diagnosis not present

## 2012-06-19 DIAGNOSIS — C8299 Follicular lymphoma, unspecified, extranodal and solid organ sites: Secondary | ICD-10-CM | POA: Diagnosis not present

## 2012-06-19 DIAGNOSIS — R1310 Dysphagia, unspecified: Secondary | ICD-10-CM | POA: Diagnosis not present

## 2012-06-19 DIAGNOSIS — E785 Hyperlipidemia, unspecified: Secondary | ICD-10-CM | POA: Diagnosis not present

## 2012-06-19 DIAGNOSIS — R5383 Other fatigue: Secondary | ICD-10-CM | POA: Diagnosis not present

## 2012-06-19 DIAGNOSIS — F05 Delirium due to known physiological condition: Secondary | ICD-10-CM | POA: Diagnosis not present

## 2012-06-19 DIAGNOSIS — J811 Chronic pulmonary edema: Secondary | ICD-10-CM | POA: Diagnosis not present

## 2012-06-19 DIAGNOSIS — C8589 Other specified types of non-Hodgkin lymphoma, extranodal and solid organ sites: Secondary | ICD-10-CM | POA: Diagnosis not present

## 2012-06-19 DIAGNOSIS — D500 Iron deficiency anemia secondary to blood loss (chronic): Secondary | ICD-10-CM | POA: Diagnosis not present

## 2012-06-19 DIAGNOSIS — M545 Low back pain: Secondary | ICD-10-CM | POA: Diagnosis not present

## 2012-06-19 DIAGNOSIS — E440 Moderate protein-calorie malnutrition: Secondary | ICD-10-CM | POA: Diagnosis not present

## 2012-06-19 DIAGNOSIS — R05 Cough: Secondary | ICD-10-CM | POA: Diagnosis not present

## 2012-06-19 DIAGNOSIS — N400 Enlarged prostate without lower urinary tract symptoms: Secondary | ICD-10-CM | POA: Diagnosis not present

## 2012-06-19 DIAGNOSIS — S72009D Fracture of unspecified part of neck of unspecified femur, subsequent encounter for closed fracture with routine healing: Secondary | ICD-10-CM | POA: Diagnosis not present

## 2012-06-19 DIAGNOSIS — I2510 Atherosclerotic heart disease of native coronary artery without angina pectoris: Secondary | ICD-10-CM | POA: Diagnosis not present

## 2012-06-19 DIAGNOSIS — IMO0002 Reserved for concepts with insufficient information to code with codable children: Secondary | ICD-10-CM | POA: Diagnosis not present

## 2012-06-19 DIAGNOSIS — M6281 Muscle weakness (generalized): Secondary | ICD-10-CM | POA: Diagnosis not present

## 2012-06-19 DIAGNOSIS — D649 Anemia, unspecified: Secondary | ICD-10-CM | POA: Diagnosis not present

## 2012-06-19 DIAGNOSIS — R630 Anorexia: Secondary | ICD-10-CM | POA: Diagnosis not present

## 2012-06-19 DIAGNOSIS — M549 Dorsalgia, unspecified: Secondary | ICD-10-CM | POA: Diagnosis not present

## 2012-06-21 DIAGNOSIS — K219 Gastro-esophageal reflux disease without esophagitis: Secondary | ICD-10-CM | POA: Diagnosis not present

## 2012-06-21 DIAGNOSIS — S72009A Fracture of unspecified part of neck of unspecified femur, initial encounter for closed fracture: Secondary | ICD-10-CM | POA: Diagnosis not present

## 2012-06-21 DIAGNOSIS — R5381 Other malaise: Secondary | ICD-10-CM | POA: Diagnosis not present

## 2012-06-21 DIAGNOSIS — F05 Delirium due to known physiological condition: Secondary | ICD-10-CM | POA: Diagnosis not present

## 2012-06-23 DIAGNOSIS — K219 Gastro-esophageal reflux disease without esophagitis: Secondary | ICD-10-CM | POA: Diagnosis not present

## 2012-06-23 DIAGNOSIS — D500 Iron deficiency anemia secondary to blood loss (chronic): Secondary | ICD-10-CM | POA: Diagnosis not present

## 2012-06-23 DIAGNOSIS — I10 Essential (primary) hypertension: Secondary | ICD-10-CM | POA: Diagnosis not present

## 2012-06-23 DIAGNOSIS — S72009A Fracture of unspecified part of neck of unspecified femur, initial encounter for closed fracture: Secondary | ICD-10-CM | POA: Diagnosis not present

## 2012-06-30 DIAGNOSIS — S72009A Fracture of unspecified part of neck of unspecified femur, initial encounter for closed fracture: Secondary | ICD-10-CM | POA: Diagnosis not present

## 2012-06-30 DIAGNOSIS — S72143A Displaced intertrochanteric fracture of unspecified femur, initial encounter for closed fracture: Secondary | ICD-10-CM | POA: Diagnosis not present

## 2012-07-05 ENCOUNTER — Encounter: Payer: Medicare Other | Admitting: Internal Medicine

## 2012-07-05 DIAGNOSIS — R630 Anorexia: Secondary | ICD-10-CM | POA: Diagnosis not present

## 2012-07-05 DIAGNOSIS — C8299 Follicular lymphoma, unspecified, extranodal and solid organ sites: Secondary | ICD-10-CM | POA: Diagnosis not present

## 2012-07-05 DIAGNOSIS — D649 Anemia, unspecified: Secondary | ICD-10-CM | POA: Diagnosis not present

## 2012-07-08 ENCOUNTER — Encounter: Payer: Self-pay | Admitting: *Deleted

## 2012-07-08 ENCOUNTER — Other Ambulatory Visit: Payer: Self-pay | Admitting: *Deleted

## 2012-07-09 ENCOUNTER — Ambulatory Visit (INDEPENDENT_AMBULATORY_CARE_PROVIDER_SITE_OTHER): Payer: Medicare Other | Admitting: Physician Assistant

## 2012-07-09 ENCOUNTER — Encounter: Payer: Self-pay | Admitting: Physician Assistant

## 2012-07-09 ENCOUNTER — Telehealth: Payer: Self-pay | Admitting: Physician Assistant

## 2012-07-09 VITALS — BP 92/52 | HR 65 | Ht 66.0 in | Wt 131.4 lb

## 2012-07-09 DIAGNOSIS — I779 Disorder of arteries and arterioles, unspecified: Secondary | ICD-10-CM | POA: Insufficient documentation

## 2012-07-09 DIAGNOSIS — I2789 Other specified pulmonary heart diseases: Secondary | ICD-10-CM | POA: Diagnosis not present

## 2012-07-09 DIAGNOSIS — I10 Essential (primary) hypertension: Secondary | ICD-10-CM | POA: Diagnosis not present

## 2012-07-09 DIAGNOSIS — I2510 Atherosclerotic heart disease of native coronary artery without angina pectoris: Secondary | ICD-10-CM

## 2012-07-09 DIAGNOSIS — I272 Pulmonary hypertension, unspecified: Secondary | ICD-10-CM

## 2012-07-09 DIAGNOSIS — I359 Nonrheumatic aortic valve disorder, unspecified: Secondary | ICD-10-CM

## 2012-07-09 DIAGNOSIS — I350 Nonrheumatic aortic (valve) stenosis: Secondary | ICD-10-CM | POA: Insufficient documentation

## 2012-07-09 MED ORDER — ATORVASTATIN CALCIUM 40 MG PO TABS: 40.0000 mg | ORAL_TABLET | Freq: Every evening | ORAL | Status: AC

## 2012-07-09 NOTE — Assessment & Plan Note (Signed)
Statin therapy to be initiated, in conjunction with ASA. Recommended reassessment in 6 months.

## 2012-07-09 NOTE — Assessment & Plan Note (Signed)
Patient will be referred to Dr. Tressie Stalker in [facility], regarding possible surgical treatment options for his severe AS, including TAVR. The patient is willing to at least obtain an opinion regarding this. We will otherwise plan on having him return in one month to follow up with Dr. Myrtis Ser, for continued close monitoring. In the meanwhile, he will be taken off HCTZ, as we had previously advised, given his underlying severe AS, and current presentation with relative hypotension.

## 2012-07-09 NOTE — Assessment & Plan Note (Signed)
Patient now presents with relative hypotension. HCTZ to be discontinued.

## 2012-07-09 NOTE — Progress Notes (Signed)
Primary Cardiologist: Jerral Bonito, MD (new)   HPI: Post hospital followup from [facility], seen in consultation by Dr. Myrtis Ser on April 23 for preoperative clearance.   Patient presented status post L hip fracture, resulting from a mechanical fall. Echocardiogram was obtained, indicating NL LVF (EF 65-70%), but with severe AS. Following review of the study, Dr. Myrtis Ser cleared patient to proceed with surgery, but with recommendation to monitor closely for development of CHF. Patient did receive 2 units PRBC (hemoglobin 6.7), and we ordered IV Lasix to be given between units.  Postoperative course was benign, and patient was maintained on a regimen of Lopressor 25 twice a day and full dose ASA. Of note, Ortho recommended patient continue treatment with full dose ASA (x6 weeks), for DVT prophylaxis. We also advised that patient not resume HCTZ at discharge, given his underlying severe AS.  Clinically, he denies any CP, tachycardia palpitations, or frank syncope. He experiences occasional dyspnea, depending on his activity level.  No Known Allergies  Current Outpatient Prescriptions  Medication Sig Dispense Refill  . acetaminophen (TYLENOL) 325 MG tablet Take 650 mg by mouth as needed for pain.      Marland Kitchen aluminum & magnesium hydroxide-simethicone (MYLANTA) 500-450-40 MG/5ML suspension Take 30 mLs by mouth as needed for indigestion.      Marland Kitchen aspirin 325 MG tablet Take 325 mg by mouth 2 (two) times daily.      Marland Kitchen docusate sodium (COLACE) 100 MG capsule Take 100 mg by mouth 2 (two) times daily.      . ferrous sulfate 325 (65 FE) MG tablet Take 325 mg by mouth daily.       . finasteride (PROSCAR) 5 MG tablet Take 5 mg by mouth daily.      Marland Kitchen HYDROcodone-acetaminophen (NORCO/VICODIN) 5-325 MG per tablet Take 1 tablet by mouth every 6 (six) hours as needed for pain.      . Megestrol Acetate (MEGACE ES PO) Take 200 mg by mouth 2 (two) times daily.      . Melatonin 3 MG CAPS Take 1 capsule by mouth at bedtime.      .  Menthol-Methyl Salicylate (MUSCLE RUB) 10-15 % CREA Apply 1 application topically as needed.      . metoprolol tartrate (LOPRESSOR) 25 MG tablet Take 25 mg by mouth 2 (two) times daily.      . Multiple Vitamin (MULTIVITAMIN) tablet Take 1 tablet by mouth daily.      Marland Kitchen omeprazole (PRILOSEC) 20 MG capsule Take 20 mg by mouth daily.      . sodium chloride 1 G tablet Take 1 g by mouth daily.      . traMADol (ULTRAM) 50 MG tablet Take 50 mg by mouth every 6 (six) hours as needed for pain.       No current facility-administered medications for this visit.    Past Medical History  Diagnosis Date  . Non Hodgkin's lymphoma     follicular cell type treated with chemotherapy  . CAD (coronary artery disease)     remote MI, treated medically, [place]  . CVA (cerebral infarction)   . Hiatal hernia   . BPH (benign prostatic hypertrophy)   . Osteoarthritis   . Malnourished   . Carotid artery disease     50-69% bilateral ICA stenosis, 03/2012  . HTN (hypertension)   . Pulmonary hypertension   . Aortic stenosis     Severe, echo, 05/2012    Past Surgical History  Procedure Laterality Date  . Inguinal hernia  repair      x's 2  . Cataract extraction      History   Social History  . Marital Status: Divorced    Spouse Name: N/A    Number of Children: N/A  . Years of Education: N/A   Occupational History  . RETIRED     use to own a convenient store   Social History Main Topics  . Smoking status: Never Smoker   . Smokeless tobacco: Current User    Types: Chew     Comment: chews occasionally  . Alcohol Use: Not on file  . Drug Use: Not on file  . Sexually Active: Not on file   Other Topics Concern  . Not on file   Social History Narrative  . No narrative on file    Family History  Problem Relation Age of Onset  . COPD Father   . Hypertension Mother   . Colon cancer Brother   . Colon cancer Sister     ROS: no nausea, vomiting; no fever, chills; no melena,  hematochezia; no claudication  PHYSICAL EXAM: BP 92/52  Pulse 65  Ht 5\' 6"  (1.676 m)  Wt 131 lb 6.4 oz (59.603 kg)  BMI 21.22 kg/m2  SpO2 80% GENERAL: 79 year old male, frail appearing; NAD HEENT: NCAT, PERRLA, EOMI; sclera clear; no xanthelasma NECK: no JVD; no TM LUNGS: CTA bilaterally CARDIAC: RRR (S1, S2); 3/6 crescendo decrescendo murmur at the base; no rubs or gallops ABDOMEN: soft, non-tender; intact BS EXTREMETIES: no significant peripheral edema SKIN: warm/dry; no obvious rash/lesions MUSCULOSKELETAL: no joint deformity NEURO: no focal deficit; NL affect   EKG:    ASSESSMENT & PLAN:  Aortic stenosis Patient will be referred to Dr. Tressie Stalker in [place], regarding possible surgical treatment options for his severe AS, including TAVR. The patient is willing to at least obtain an opinion regarding this. We will otherwise plan on having him return in one month to follow up with Dr. Myrtis Ser, for continued close monitoring. In the meanwhile, he will be taken off HCTZ, as we had previously advised, given his underlying severe AS, and current presentation with relative hypotension.  CAD (coronary artery disease) Quiescent on medical therapy consisting of ASA and beta blocker. Recent echocardiogram indicated NL LVF, with no focal WMAs. Will initiate statin therapy with Lipitor 40 daily, and reassess lipid status in 12 weeks. Recommend aggressive management with target LDL 70 or less, if feasible.  HTN (hypertension) Patient now presents with relative hypotension. HCTZ to be discontinued.  Carotid artery disease Statin therapy to be initiated, in conjunction with ASA. Recommended reassessment in 6 months.    Gene Jamesyn [place], PAC

## 2012-07-09 NOTE — Assessment & Plan Note (Signed)
Quiescent on medical therapy consisting of ASA and beta blocker. Recent echocardiogram indicated NL LVF, with no focal WMAs. Will initiate statin therapy with Lipitor 40 daily, and reassess lipid status in 12 weeks. Recommend aggressive management with target LDL 70 or less, if feasible.

## 2012-07-09 NOTE — Patient Instructions (Addendum)
   Stop HCTZ  Begin Lipitor 40mg  every evening  Labs in 3 months for cholesterol - will mail reminder when time Continue all other current medications. Referral to Dr. Tressie Stalker Follow up in  1 month

## 2012-07-22 ENCOUNTER — Encounter: Payer: Self-pay | Admitting: Cardiology

## 2012-07-22 NOTE — Progress Notes (Signed)
   I received information that the patient does not want to consider a procedure for his aortic valve at this time. This was sent to me by the TAVR coordinator. It was mentioned that this was made clear by the family also. I will see him back in the office.

## 2012-07-23 DIAGNOSIS — M545 Low back pain: Secondary | ICD-10-CM | POA: Diagnosis not present

## 2012-07-23 DIAGNOSIS — S72009A Fracture of unspecified part of neck of unspecified femur, initial encounter for closed fracture: Secondary | ICD-10-CM | POA: Diagnosis not present

## 2012-07-23 DIAGNOSIS — D649 Anemia, unspecified: Secondary | ICD-10-CM | POA: Diagnosis not present

## 2012-07-23 DIAGNOSIS — K219 Gastro-esophageal reflux disease without esophagitis: Secondary | ICD-10-CM | POA: Diagnosis not present

## 2012-07-26 DIAGNOSIS — R05 Cough: Secondary | ICD-10-CM | POA: Diagnosis not present

## 2012-07-26 DIAGNOSIS — J811 Chronic pulmonary edema: Secondary | ICD-10-CM | POA: Diagnosis not present

## 2012-07-29 NOTE — Telephone Encounter (Signed)
Closing out encounter, initial encounter was done at [employer], I was filling in for the day. I am now back at [facility] filling in the assignments there. - kal

## 2012-08-05 ENCOUNTER — Telehealth: Payer: Self-pay | Admitting: Cardiology

## 2012-08-05 NOTE — Telephone Encounter (Signed)
Dennis Cummings called the office and states that he wants to cancel all future appointments will  Call the office back if he decides he wants to come back.

## 2012-08-10 ENCOUNTER — Ambulatory Visit: Payer: Medicare Other | Admitting: Cardiology

## 2012-08-11 DIAGNOSIS — S72143A Displaced intertrochanteric fracture of unspecified femur, initial encounter for closed fracture: Secondary | ICD-10-CM | POA: Diagnosis not present

## 2012-08-13 DIAGNOSIS — Z0000 Encounter for general adult medical examination without abnormal findings: Secondary | ICD-10-CM | POA: Diagnosis not present

## 2012-08-13 DIAGNOSIS — K219 Gastro-esophageal reflux disease without esophagitis: Secondary | ICD-10-CM | POA: Diagnosis not present

## 2012-08-13 DIAGNOSIS — D649 Anemia, unspecified: Secondary | ICD-10-CM | POA: Diagnosis not present

## 2012-08-13 DIAGNOSIS — S72009A Fracture of unspecified part of neck of unspecified femur, initial encounter for closed fracture: Secondary | ICD-10-CM | POA: Diagnosis not present

## 2012-08-13 DIAGNOSIS — M549 Dorsalgia, unspecified: Secondary | ICD-10-CM | POA: Diagnosis not present

## 2012-08-13 DIAGNOSIS — E785 Hyperlipidemia, unspecified: Secondary | ICD-10-CM | POA: Diagnosis not present

## 2012-08-16 ENCOUNTER — Telehealth: Payer: Self-pay | Admitting: Cardiology

## 2012-08-16 DIAGNOSIS — Z9181 History of falling: Secondary | ICD-10-CM | POA: Diagnosis not present

## 2012-08-16 DIAGNOSIS — I69919 Unspecified symptoms and signs involving cognitive functions following unspecified cerebrovascular disease: Secondary | ICD-10-CM | POA: Diagnosis not present

## 2012-08-16 DIAGNOSIS — I2510 Atherosclerotic heart disease of native coronary artery without angina pectoris: Secondary | ICD-10-CM | POA: Diagnosis not present

## 2012-08-16 DIAGNOSIS — C8589 Other specified types of non-Hodgkin lymphoma, extranodal and solid organ sites: Secondary | ICD-10-CM | POA: Diagnosis not present

## 2012-08-16 DIAGNOSIS — M159 Polyosteoarthritis, unspecified: Secondary | ICD-10-CM | POA: Diagnosis not present

## 2012-08-16 DIAGNOSIS — S72009D Fracture of unspecified part of neck of unspecified femur, subsequent encounter for closed fracture with routine healing: Secondary | ICD-10-CM | POA: Diagnosis not present

## 2012-08-16 NOTE — Telephone Encounter (Signed)
Dennis Cummings called and stated he wants to cancel all appointments at this time. He will call back if he wants to come back to office.

## 2012-08-18 DIAGNOSIS — H524 Presbyopia: Secondary | ICD-10-CM | POA: Diagnosis not present

## 2012-08-18 DIAGNOSIS — H52229 Regular astigmatism, unspecified eye: Secondary | ICD-10-CM | POA: Diagnosis not present

## 2012-08-18 DIAGNOSIS — H26499 Other secondary cataract, unspecified eye: Secondary | ICD-10-CM | POA: Diagnosis not present

## 2012-08-18 DIAGNOSIS — Z961 Presence of intraocular lens: Secondary | ICD-10-CM | POA: Diagnosis not present

## 2012-08-20 DIAGNOSIS — S72009D Fracture of unspecified part of neck of unspecified femur, subsequent encounter for closed fracture with routine healing: Secondary | ICD-10-CM | POA: Diagnosis not present

## 2012-08-20 DIAGNOSIS — C8589 Other specified types of non-Hodgkin lymphoma, extranodal and solid organ sites: Secondary | ICD-10-CM | POA: Diagnosis not present

## 2012-08-20 DIAGNOSIS — Z9181 History of falling: Secondary | ICD-10-CM | POA: Diagnosis not present

## 2012-08-20 DIAGNOSIS — M159 Polyosteoarthritis, unspecified: Secondary | ICD-10-CM | POA: Diagnosis not present

## 2012-08-20 DIAGNOSIS — I69919 Unspecified symptoms and signs involving cognitive functions following unspecified cerebrovascular disease: Secondary | ICD-10-CM | POA: Diagnosis not present

## 2012-08-20 DIAGNOSIS — I2510 Atherosclerotic heart disease of native coronary artery without angina pectoris: Secondary | ICD-10-CM | POA: Diagnosis not present

## 2012-08-25 DIAGNOSIS — I69919 Unspecified symptoms and signs involving cognitive functions following unspecified cerebrovascular disease: Secondary | ICD-10-CM | POA: Diagnosis not present

## 2012-08-25 DIAGNOSIS — Z9181 History of falling: Secondary | ICD-10-CM | POA: Diagnosis not present

## 2012-08-25 DIAGNOSIS — M159 Polyosteoarthritis, unspecified: Secondary | ICD-10-CM | POA: Diagnosis not present

## 2012-08-25 DIAGNOSIS — C8589 Other specified types of non-Hodgkin lymphoma, extranodal and solid organ sites: Secondary | ICD-10-CM | POA: Diagnosis not present

## 2012-08-25 DIAGNOSIS — I2510 Atherosclerotic heart disease of native coronary artery without angina pectoris: Secondary | ICD-10-CM | POA: Diagnosis not present

## 2012-08-25 DIAGNOSIS — S72009D Fracture of unspecified part of neck of unspecified femur, subsequent encounter for closed fracture with routine healing: Secondary | ICD-10-CM | POA: Diagnosis not present

## 2012-09-02 DIAGNOSIS — C8589 Other specified types of non-Hodgkin lymphoma, extranodal and solid organ sites: Secondary | ICD-10-CM | POA: Diagnosis not present

## 2012-09-02 DIAGNOSIS — M159 Polyosteoarthritis, unspecified: Secondary | ICD-10-CM | POA: Diagnosis not present

## 2012-09-02 DIAGNOSIS — I2510 Atherosclerotic heart disease of native coronary artery without angina pectoris: Secondary | ICD-10-CM | POA: Diagnosis not present

## 2012-09-02 DIAGNOSIS — S72009D Fracture of unspecified part of neck of unspecified femur, subsequent encounter for closed fracture with routine healing: Secondary | ICD-10-CM | POA: Diagnosis not present

## 2012-09-02 DIAGNOSIS — Z9181 History of falling: Secondary | ICD-10-CM | POA: Diagnosis not present

## 2012-09-02 DIAGNOSIS — I69919 Unspecified symptoms and signs involving cognitive functions following unspecified cerebrovascular disease: Secondary | ICD-10-CM | POA: Diagnosis not present

## 2012-09-08 DIAGNOSIS — M159 Polyosteoarthritis, unspecified: Secondary | ICD-10-CM | POA: Diagnosis not present

## 2012-09-08 DIAGNOSIS — I2510 Atherosclerotic heart disease of native coronary artery without angina pectoris: Secondary | ICD-10-CM | POA: Diagnosis not present

## 2012-09-08 DIAGNOSIS — I69919 Unspecified symptoms and signs involving cognitive functions following unspecified cerebrovascular disease: Secondary | ICD-10-CM | POA: Diagnosis not present

## 2012-09-08 DIAGNOSIS — Z9181 History of falling: Secondary | ICD-10-CM | POA: Diagnosis not present

## 2012-09-08 DIAGNOSIS — S72009D Fracture of unspecified part of neck of unspecified femur, subsequent encounter for closed fracture with routine healing: Secondary | ICD-10-CM | POA: Diagnosis not present

## 2012-09-08 DIAGNOSIS — C8589 Other specified types of non-Hodgkin lymphoma, extranodal and solid organ sites: Secondary | ICD-10-CM | POA: Diagnosis not present

## 2012-09-15 DIAGNOSIS — S72009D Fracture of unspecified part of neck of unspecified femur, subsequent encounter for closed fracture with routine healing: Secondary | ICD-10-CM | POA: Diagnosis not present

## 2012-09-15 DIAGNOSIS — I69919 Unspecified symptoms and signs involving cognitive functions following unspecified cerebrovascular disease: Secondary | ICD-10-CM | POA: Diagnosis not present

## 2012-09-15 DIAGNOSIS — M159 Polyosteoarthritis, unspecified: Secondary | ICD-10-CM | POA: Diagnosis not present

## 2012-09-15 DIAGNOSIS — Z9181 History of falling: Secondary | ICD-10-CM | POA: Diagnosis not present

## 2012-09-15 DIAGNOSIS — C8589 Other specified types of non-Hodgkin lymphoma, extranodal and solid organ sites: Secondary | ICD-10-CM | POA: Diagnosis not present

## 2012-09-15 DIAGNOSIS — I2510 Atherosclerotic heart disease of native coronary artery without angina pectoris: Secondary | ICD-10-CM | POA: Diagnosis not present

## 2012-10-07 DIAGNOSIS — H353190 Nonexudative age-related macular degeneration, unspecified eye, stage unspecified: Secondary | ICD-10-CM | POA: Diagnosis not present

## 2012-10-18 ENCOUNTER — Encounter (INDEPENDENT_AMBULATORY_CARE_PROVIDER_SITE_OTHER): Payer: Medicare Other

## 2012-10-18 DIAGNOSIS — Z96649 Presence of unspecified artificial hip joint: Secondary | ICD-10-CM | POA: Diagnosis not present

## 2012-10-18 DIAGNOSIS — C8589 Other specified types of non-Hodgkin lymphoma, extranodal and solid organ sites: Secondary | ICD-10-CM | POA: Diagnosis not present

## 2012-10-18 DIAGNOSIS — C8299 Follicular lymphoma, unspecified, extranodal and solid organ sites: Secondary | ICD-10-CM | POA: Diagnosis not present

## 2012-10-18 DIAGNOSIS — M810 Age-related osteoporosis without current pathological fracture: Secondary | ICD-10-CM | POA: Diagnosis not present

## 2012-10-18 DIAGNOSIS — E876 Hypokalemia: Secondary | ICD-10-CM | POA: Diagnosis not present

## 2012-10-18 DIAGNOSIS — D649 Anemia, unspecified: Secondary | ICD-10-CM | POA: Diagnosis not present

## 2012-10-18 DIAGNOSIS — F17200 Nicotine dependence, unspecified, uncomplicated: Secondary | ICD-10-CM | POA: Diagnosis not present

## 2012-11-12 DIAGNOSIS — M549 Dorsalgia, unspecified: Secondary | ICD-10-CM | POA: Diagnosis not present

## 2012-11-16 DIAGNOSIS — D649 Anemia, unspecified: Secondary | ICD-10-CM | POA: Diagnosis not present

## 2012-11-16 DIAGNOSIS — E876 Hypokalemia: Secondary | ICD-10-CM | POA: Diagnosis not present

## 2012-11-16 DIAGNOSIS — E871 Hypo-osmolality and hyponatremia: Secondary | ICD-10-CM | POA: Diagnosis not present

## 2012-11-16 DIAGNOSIS — C8589 Other specified types of non-Hodgkin lymphoma, extranodal and solid organ sites: Secondary | ICD-10-CM | POA: Diagnosis not present

## 2012-11-24 DIAGNOSIS — E871 Hypo-osmolality and hyponatremia: Secondary | ICD-10-CM | POA: Diagnosis not present

## 2012-11-24 DIAGNOSIS — D649 Anemia, unspecified: Secondary | ICD-10-CM | POA: Diagnosis not present

## 2012-11-24 DIAGNOSIS — M810 Age-related osteoporosis without current pathological fracture: Secondary | ICD-10-CM | POA: Diagnosis not present

## 2012-11-24 DIAGNOSIS — C8589 Other specified types of non-Hodgkin lymphoma, extranodal and solid organ sites: Secondary | ICD-10-CM | POA: Diagnosis not present

## 2012-11-24 DIAGNOSIS — E876 Hypokalemia: Secondary | ICD-10-CM | POA: Diagnosis not present

## 2012-12-16 DIAGNOSIS — M810 Age-related osteoporosis without current pathological fracture: Secondary | ICD-10-CM | POA: Diagnosis not present

## 2012-12-16 DIAGNOSIS — E871 Hypo-osmolality and hyponatremia: Secondary | ICD-10-CM | POA: Diagnosis not present

## 2012-12-16 DIAGNOSIS — D649 Anemia, unspecified: Secondary | ICD-10-CM | POA: Diagnosis not present

## 2012-12-16 DIAGNOSIS — E876 Hypokalemia: Secondary | ICD-10-CM | POA: Diagnosis not present

## 2012-12-16 DIAGNOSIS — C8589 Other specified types of non-Hodgkin lymphoma, extranodal and solid organ sites: Secondary | ICD-10-CM | POA: Diagnosis not present

## 2013-01-17 ENCOUNTER — Encounter (INDEPENDENT_AMBULATORY_CARE_PROVIDER_SITE_OTHER): Payer: Medicare Other

## 2013-01-17 DIAGNOSIS — C8589 Other specified types of non-Hodgkin lymphoma, extranodal and solid organ sites: Secondary | ICD-10-CM | POA: Diagnosis not present

## 2013-01-17 DIAGNOSIS — M549 Dorsalgia, unspecified: Secondary | ICD-10-CM

## 2013-01-26 DIAGNOSIS — M549 Dorsalgia, unspecified: Secondary | ICD-10-CM | POA: Diagnosis not present

## 2013-01-26 DIAGNOSIS — C8589 Other specified types of non-Hodgkin lymphoma, extranodal and solid organ sites: Secondary | ICD-10-CM | POA: Diagnosis not present

## 2013-01-27 DIAGNOSIS — C8589 Other specified types of non-Hodgkin lymphoma, extranodal and solid organ sites: Secondary | ICD-10-CM | POA: Diagnosis not present

## 2013-01-27 DIAGNOSIS — M549 Dorsalgia, unspecified: Secondary | ICD-10-CM | POA: Diagnosis not present

## 2013-02-03 DIAGNOSIS — M899 Disorder of bone, unspecified: Secondary | ICD-10-CM | POA: Diagnosis not present

## 2013-02-03 DIAGNOSIS — M949 Disorder of cartilage, unspecified: Secondary | ICD-10-CM

## 2013-02-03 DIAGNOSIS — M549 Dorsalgia, unspecified: Secondary | ICD-10-CM | POA: Diagnosis not present

## 2013-02-03 DIAGNOSIS — C8589 Other specified types of non-Hodgkin lymphoma, extranodal and solid organ sites: Secondary | ICD-10-CM | POA: Diagnosis not present

## 2013-02-11 DIAGNOSIS — Z79899 Other long term (current) drug therapy: Secondary | ICD-10-CM | POA: Diagnosis not present

## 2013-02-21 ENCOUNTER — Other Ambulatory Visit (HOSPITAL_COMMUNITY): Payer: Self-pay | Admitting: Hematology and Oncology

## 2013-02-21 DIAGNOSIS — C8590 Non-Hodgkin lymphoma, unspecified, unspecified site: Secondary | ICD-10-CM

## 2013-03-03 ENCOUNTER — Ambulatory Visit (HOSPITAL_COMMUNITY): Admission: RE | Admit: 2013-03-03 | Payer: Medicare Other | Source: Ambulatory Visit

## 2013-03-10 ENCOUNTER — Encounter (HOSPITAL_COMMUNITY): Payer: Self-pay

## 2013-03-10 ENCOUNTER — Encounter (HOSPITAL_COMMUNITY)
Admission: RE | Admit: 2013-03-10 | Discharge: 2013-03-10 | Disposition: A | Discharge status: Home / Self Care | Payer: Medicare Other | Source: Ambulatory Visit | Attending: Hematology and Oncology | Admitting: Hematology and Oncology

## 2013-03-10 DIAGNOSIS — C8590 Non-Hodgkin lymphoma, unspecified, unspecified site: Secondary | ICD-10-CM

## 2013-03-10 DIAGNOSIS — C8589 Other specified types of non-Hodgkin lymphoma, extranodal and solid organ sites: Secondary | ICD-10-CM | POA: Diagnosis not present

## 2013-03-10 LAB — GLUCOSE, CAPILLARY: Glucose-Capillary: 93 mg/dL (ref 70–99)

## 2013-03-10 MED ORDER — FLUDEOXYGLUCOSE F - 18 (FDG) INJECTION
18.1000 | Freq: Once | INTRAVENOUS | Status: AC | PRN
  Administered 2013-03-10: 18.1 via INTRAVENOUS

## 2013-03-16 DIAGNOSIS — C8299 Follicular lymphoma, unspecified, extranodal and solid organ sites: Secondary | ICD-10-CM | POA: Diagnosis not present

## 2013-03-16 DIAGNOSIS — C8589 Other specified types of non-Hodgkin lymphoma, extranodal and solid organ sites: Secondary | ICD-10-CM | POA: Diagnosis not present

## 2013-03-16 DIAGNOSIS — C499 Malignant neoplasm of connective and soft tissue, unspecified: Secondary | ICD-10-CM | POA: Diagnosis not present

## 2013-05-12 DIAGNOSIS — G909 Disorder of the autonomic nervous system, unspecified: Secondary | ICD-10-CM | POA: Diagnosis not present

## 2013-05-19 DIAGNOSIS — R05 Cough: Secondary | ICD-10-CM | POA: Diagnosis not present

## 2013-05-19 DIAGNOSIS — J019 Acute sinusitis, unspecified: Secondary | ICD-10-CM | POA: Diagnosis not present

## 2013-05-19 DIAGNOSIS — R059 Cough, unspecified: Secondary | ICD-10-CM | POA: Diagnosis not present

## 2013-07-01 DIAGNOSIS — G609 Hereditary and idiopathic neuropathy, unspecified: Secondary | ICD-10-CM | POA: Diagnosis not present

## 2013-07-01 DIAGNOSIS — Z5320 Procedure and treatment not carried out because of patient's decision for unspecified reasons: Secondary | ICD-10-CM | POA: Diagnosis not present

## 2013-08-01 DIAGNOSIS — M79609 Pain in unspecified limb: Secondary | ICD-10-CM | POA: Diagnosis not present

## 2013-08-01 DIAGNOSIS — M25579 Pain in unspecified ankle and joints of unspecified foot: Secondary | ICD-10-CM | POA: Diagnosis not present

## 2013-08-11 DIAGNOSIS — M549 Dorsalgia, unspecified: Secondary | ICD-10-CM | POA: Diagnosis not present

## 2013-08-11 DIAGNOSIS — G909 Disorder of the autonomic nervous system, unspecified: Secondary | ICD-10-CM | POA: Diagnosis not present

## 2013-08-16 DIAGNOSIS — G5790 Unspecified mononeuropathy of unspecified lower limb: Secondary | ICD-10-CM | POA: Diagnosis not present

## 2013-08-16 DIAGNOSIS — G608 Other hereditary and idiopathic neuropathies: Secondary | ICD-10-CM | POA: Diagnosis not present

## 2013-08-16 DIAGNOSIS — M79609 Pain in unspecified limb: Secondary | ICD-10-CM | POA: Diagnosis not present

## 2013-09-06 DIAGNOSIS — G5790 Unspecified mononeuropathy of unspecified lower limb: Secondary | ICD-10-CM | POA: Diagnosis not present

## 2013-09-06 DIAGNOSIS — G608 Other hereditary and idiopathic neuropathies: Secondary | ICD-10-CM | POA: Diagnosis not present

## 2013-09-06 DIAGNOSIS — M79609 Pain in unspecified limb: Secondary | ICD-10-CM | POA: Diagnosis not present

## 2013-09-15 DIAGNOSIS — C8589 Other specified types of non-Hodgkin lymphoma, extranodal and solid organ sites: Secondary | ICD-10-CM | POA: Diagnosis not present

## 2013-09-16 DIAGNOSIS — Z923 Personal history of irradiation: Secondary | ICD-10-CM | POA: Diagnosis not present

## 2013-09-16 DIAGNOSIS — C8299 Follicular lymphoma, unspecified, extranodal and solid organ sites: Secondary | ICD-10-CM | POA: Diagnosis not present

## 2013-09-16 DIAGNOSIS — C7952 Secondary malignant neoplasm of bone marrow: Secondary | ICD-10-CM | POA: Diagnosis not present

## 2013-09-16 DIAGNOSIS — C7951 Secondary malignant neoplasm of bone: Secondary | ICD-10-CM | POA: Diagnosis not present

## 2013-09-16 DIAGNOSIS — R911 Solitary pulmonary nodule: Secondary | ICD-10-CM | POA: Diagnosis not present

## 2013-09-19 DIAGNOSIS — R911 Solitary pulmonary nodule: Secondary | ICD-10-CM | POA: Diagnosis not present

## 2013-09-22 DIAGNOSIS — H6120 Impacted cerumen, unspecified ear: Secondary | ICD-10-CM | POA: Diagnosis not present

## 2013-09-26 DIAGNOSIS — C8589 Other specified types of non-Hodgkin lymphoma, extranodal and solid organ sites: Secondary | ICD-10-CM | POA: Diagnosis not present

## 2013-09-26 DIAGNOSIS — Z510 Encounter for antineoplastic radiation therapy: Secondary | ICD-10-CM | POA: Diagnosis not present

## 2013-09-27 DIAGNOSIS — C8589 Other specified types of non-Hodgkin lymphoma, extranodal and solid organ sites: Secondary | ICD-10-CM | POA: Diagnosis not present

## 2013-09-27 DIAGNOSIS — Z510 Encounter for antineoplastic radiation therapy: Secondary | ICD-10-CM | POA: Diagnosis not present

## 2013-09-28 DIAGNOSIS — Z510 Encounter for antineoplastic radiation therapy: Secondary | ICD-10-CM | POA: Diagnosis not present

## 2013-09-28 DIAGNOSIS — C8589 Other specified types of non-Hodgkin lymphoma, extranodal and solid organ sites: Secondary | ICD-10-CM | POA: Diagnosis not present

## 2013-09-29 DIAGNOSIS — Z510 Encounter for antineoplastic radiation therapy: Secondary | ICD-10-CM | POA: Diagnosis not present

## 2013-09-29 DIAGNOSIS — C8589 Other specified types of non-Hodgkin lymphoma, extranodal and solid organ sites: Secondary | ICD-10-CM | POA: Diagnosis not present

## 2013-09-30 DIAGNOSIS — C8589 Other specified types of non-Hodgkin lymphoma, extranodal and solid organ sites: Secondary | ICD-10-CM | POA: Diagnosis not present

## 2013-09-30 DIAGNOSIS — Z510 Encounter for antineoplastic radiation therapy: Secondary | ICD-10-CM | POA: Diagnosis not present

## 2013-10-04 DIAGNOSIS — G5790 Unspecified mononeuropathy of unspecified lower limb: Secondary | ICD-10-CM | POA: Diagnosis not present

## 2013-10-04 DIAGNOSIS — M79609 Pain in unspecified limb: Secondary | ICD-10-CM | POA: Diagnosis not present

## 2013-10-04 DIAGNOSIS — G608 Other hereditary and idiopathic neuropathies: Secondary | ICD-10-CM | POA: Diagnosis not present

## 2013-10-11 DIAGNOSIS — M549 Dorsalgia, unspecified: Secondary | ICD-10-CM | POA: Diagnosis not present

## 2013-10-11 DIAGNOSIS — G909 Disorder of the autonomic nervous system, unspecified: Secondary | ICD-10-CM | POA: Diagnosis not present

## 2013-10-28 DIAGNOSIS — Z923 Personal history of irradiation: Secondary | ICD-10-CM | POA: Diagnosis not present

## 2013-10-28 DIAGNOSIS — C8299 Follicular lymphoma, unspecified, extranodal and solid organ sites: Secondary | ICD-10-CM | POA: Diagnosis not present

## 2013-12-02 DIAGNOSIS — G629 Polyneuropathy, unspecified: Secondary | ICD-10-CM | POA: Diagnosis not present

## 2013-12-02 DIAGNOSIS — M25551 Pain in right hip: Secondary | ICD-10-CM | POA: Diagnosis not present

## 2013-12-02 DIAGNOSIS — Z923 Personal history of irradiation: Secondary | ICD-10-CM | POA: Diagnosis not present

## 2013-12-02 DIAGNOSIS — C8290 Follicular lymphoma, unspecified, unspecified site: Secondary | ICD-10-CM | POA: Diagnosis not present

## 2013-12-02 DIAGNOSIS — M25552 Pain in left hip: Secondary | ICD-10-CM | POA: Diagnosis not present

## 2013-12-06 DIAGNOSIS — Z923 Personal history of irradiation: Secondary | ICD-10-CM | POA: Diagnosis not present

## 2013-12-06 DIAGNOSIS — G629 Polyneuropathy, unspecified: Secondary | ICD-10-CM | POA: Diagnosis not present

## 2013-12-06 DIAGNOSIS — C8290 Follicular lymphoma, unspecified, unspecified site: Secondary | ICD-10-CM | POA: Diagnosis not present

## 2013-12-06 DIAGNOSIS — M25552 Pain in left hip: Secondary | ICD-10-CM | POA: Diagnosis not present

## 2013-12-06 DIAGNOSIS — M25551 Pain in right hip: Secondary | ICD-10-CM | POA: Diagnosis not present

## 2013-12-06 DIAGNOSIS — C8580 Other specified types of non-Hodgkin lymphoma, unspecified site: Secondary | ICD-10-CM | POA: Diagnosis not present

## 2013-12-09 DIAGNOSIS — M25519 Pain in unspecified shoulder: Secondary | ICD-10-CM | POA: Diagnosis not present

## 2013-12-09 DIAGNOSIS — Z1389 Encounter for screening for other disorder: Secondary | ICD-10-CM | POA: Diagnosis not present

## 2013-12-09 DIAGNOSIS — Z79899 Other long term (current) drug therapy: Secondary | ICD-10-CM | POA: Diagnosis not present

## 2013-12-09 DIAGNOSIS — Z0000 Encounter for general adult medical examination without abnormal findings: Secondary | ICD-10-CM | POA: Diagnosis not present

## 2013-12-16 DIAGNOSIS — M25551 Pain in right hip: Secondary | ICD-10-CM | POA: Diagnosis not present

## 2013-12-16 DIAGNOSIS — Z923 Personal history of irradiation: Secondary | ICD-10-CM | POA: Diagnosis not present

## 2013-12-16 DIAGNOSIS — M25552 Pain in left hip: Secondary | ICD-10-CM | POA: Diagnosis not present

## 2013-12-16 DIAGNOSIS — G629 Polyneuropathy, unspecified: Secondary | ICD-10-CM | POA: Diagnosis not present

## 2013-12-16 DIAGNOSIS — C8290 Follicular lymphoma, unspecified, unspecified site: Secondary | ICD-10-CM | POA: Diagnosis not present

## 2013-12-22 DIAGNOSIS — R918 Other nonspecific abnormal finding of lung field: Secondary | ICD-10-CM | POA: Diagnosis not present

## 2013-12-22 DIAGNOSIS — C8590 Non-Hodgkin lymphoma, unspecified, unspecified site: Secondary | ICD-10-CM | POA: Diagnosis not present

## 2013-12-29 DIAGNOSIS — C8590 Non-Hodgkin lymphoma, unspecified, unspecified site: Secondary | ICD-10-CM | POA: Diagnosis not present

## 2013-12-29 DIAGNOSIS — R918 Other nonspecific abnormal finding of lung field: Secondary | ICD-10-CM | POA: Diagnosis not present

## 2013-12-29 DIAGNOSIS — R937 Abnormal findings on diagnostic imaging of other parts of musculoskeletal system: Secondary | ICD-10-CM | POA: Diagnosis not present

## 2014-01-05 DIAGNOSIS — R918 Other nonspecific abnormal finding of lung field: Secondary | ICD-10-CM | POA: Diagnosis not present

## 2014-01-05 DIAGNOSIS — C8590 Non-Hodgkin lymphoma, unspecified, unspecified site: Secondary | ICD-10-CM | POA: Diagnosis not present

## 2014-01-17 DIAGNOSIS — J90 Pleural effusion, not elsewhere classified: Secondary | ICD-10-CM | POA: Diagnosis not present

## 2014-01-17 DIAGNOSIS — R918 Other nonspecific abnormal finding of lung field: Secondary | ICD-10-CM | POA: Diagnosis not present

## 2014-01-17 DIAGNOSIS — C8590 Non-Hodgkin lymphoma, unspecified, unspecified site: Secondary | ICD-10-CM | POA: Diagnosis not present

## 2014-01-17 DIAGNOSIS — C8519 Unspecified B-cell lymphoma, extranodal and solid organ sites: Secondary | ICD-10-CM | POA: Diagnosis not present

## 2014-01-17 DIAGNOSIS — J9811 Atelectasis: Secondary | ICD-10-CM | POA: Diagnosis not present

## 2014-01-25 DIAGNOSIS — C8590 Non-Hodgkin lymphoma, unspecified, unspecified site: Secondary | ICD-10-CM | POA: Diagnosis not present

## 2014-01-25 DIAGNOSIS — G629 Polyneuropathy, unspecified: Secondary | ICD-10-CM | POA: Diagnosis not present

## 2014-01-25 DIAGNOSIS — M810 Age-related osteoporosis without current pathological fracture: Secondary | ICD-10-CM | POA: Diagnosis not present

## 2014-01-25 DIAGNOSIS — M1990 Unspecified osteoarthritis, unspecified site: Secondary | ICD-10-CM | POA: Diagnosis not present

## 2014-02-10 DIAGNOSIS — M5136 Other intervertebral disc degeneration, lumbar region: Secondary | ICD-10-CM | POA: Diagnosis not present

## 2014-02-10 DIAGNOSIS — C8290 Follicular lymphoma, unspecified, unspecified site: Secondary | ICD-10-CM | POA: Diagnosis not present

## 2014-02-10 DIAGNOSIS — M47816 Spondylosis without myelopathy or radiculopathy, lumbar region: Secondary | ICD-10-CM | POA: Diagnosis not present

## 2014-02-10 DIAGNOSIS — M4806 Spinal stenosis, lumbar region: Secondary | ICD-10-CM | POA: Diagnosis not present

## 2014-02-15 DIAGNOSIS — M898X8 Other specified disorders of bone, other site: Secondary | ICD-10-CM | POA: Diagnosis not present

## 2014-02-15 DIAGNOSIS — C8590 Non-Hodgkin lymphoma, unspecified, unspecified site: Secondary | ICD-10-CM | POA: Diagnosis not present

## 2014-02-20 DIAGNOSIS — Z79899 Other long term (current) drug therapy: Secondary | ICD-10-CM | POA: Diagnosis not present

## 2014-02-20 DIAGNOSIS — G629 Polyneuropathy, unspecified: Secondary | ICD-10-CM | POA: Diagnosis not present

## 2014-02-20 DIAGNOSIS — I10 Essential (primary) hypertension: Secondary | ICD-10-CM | POA: Diagnosis not present

## 2014-02-20 DIAGNOSIS — K219 Gastro-esophageal reflux disease without esophagitis: Secondary | ICD-10-CM | POA: Diagnosis not present

## 2014-02-20 DIAGNOSIS — M47816 Spondylosis without myelopathy or radiculopathy, lumbar region: Secondary | ICD-10-CM | POA: Diagnosis not present

## 2014-02-20 DIAGNOSIS — M4806 Spinal stenosis, lumbar region: Secondary | ICD-10-CM | POA: Diagnosis not present

## 2014-02-20 DIAGNOSIS — F17220 Nicotine dependence, chewing tobacco, uncomplicated: Secondary | ICD-10-CM | POA: Diagnosis not present

## 2014-02-20 DIAGNOSIS — C8290 Follicular lymphoma, unspecified, unspecified site: Secondary | ICD-10-CM | POA: Diagnosis not present

## 2014-02-20 DIAGNOSIS — M5136 Other intervertebral disc degeneration, lumbar region: Secondary | ICD-10-CM | POA: Diagnosis not present

## 2014-02-22 DIAGNOSIS — M810 Age-related osteoporosis without current pathological fracture: Secondary | ICD-10-CM | POA: Diagnosis not present

## 2014-02-22 DIAGNOSIS — M1990 Unspecified osteoarthritis, unspecified site: Secondary | ICD-10-CM | POA: Diagnosis not present

## 2014-02-22 DIAGNOSIS — M549 Dorsalgia, unspecified: Secondary | ICD-10-CM | POA: Diagnosis not present

## 2014-02-22 DIAGNOSIS — C8590 Non-Hodgkin lymphoma, unspecified, unspecified site: Secondary | ICD-10-CM | POA: Diagnosis not present

## 2014-02-22 DIAGNOSIS — G629 Polyneuropathy, unspecified: Secondary | ICD-10-CM | POA: Diagnosis not present

## 2014-03-23 DIAGNOSIS — C8290 Follicular lymphoma, unspecified, unspecified site: Secondary | ICD-10-CM | POA: Diagnosis not present

## 2014-03-23 DIAGNOSIS — M5136 Other intervertebral disc degeneration, lumbar region: Secondary | ICD-10-CM | POA: Diagnosis not present

## 2014-03-23 DIAGNOSIS — M47816 Spondylosis without myelopathy or radiculopathy, lumbar region: Secondary | ICD-10-CM | POA: Diagnosis not present

## 2014-03-23 DIAGNOSIS — G629 Polyneuropathy, unspecified: Secondary | ICD-10-CM | POA: Diagnosis not present

## 2014-03-27 DIAGNOSIS — J189 Pneumonia, unspecified organism: Secondary | ICD-10-CM | POA: Diagnosis not present

## 2014-03-29 DIAGNOSIS — J189 Pneumonia, unspecified organism: Secondary | ICD-10-CM | POA: Diagnosis not present

## 2014-04-05 DIAGNOSIS — R05 Cough: Secondary | ICD-10-CM | POA: Diagnosis not present

## 2014-04-05 DIAGNOSIS — J189 Pneumonia, unspecified organism: Secondary | ICD-10-CM | POA: Diagnosis not present

## 2014-04-13 DIAGNOSIS — R935 Abnormal findings on diagnostic imaging of other abdominal regions, including retroperitoneum: Secondary | ICD-10-CM | POA: Diagnosis not present

## 2014-04-13 DIAGNOSIS — C8590 Non-Hodgkin lymphoma, unspecified, unspecified site: Secondary | ICD-10-CM | POA: Diagnosis not present

## 2014-04-14 ENCOUNTER — Other Ambulatory Visit (HOSPITAL_COMMUNITY): Payer: Self-pay | Admitting: Internal Medicine

## 2014-04-14 DIAGNOSIS — C8592 Non-Hodgkin lymphoma, unspecified, intrathoracic lymph nodes: Secondary | ICD-10-CM | POA: Diagnosis not present

## 2014-04-14 DIAGNOSIS — C8599 Non-Hodgkin lymphoma, unspecified, extranodal and solid organ sites: Secondary | ICD-10-CM

## 2014-04-20 ENCOUNTER — Ambulatory Visit (HOSPITAL_COMMUNITY)
Admission: RE | Admit: 2014-04-20 | Discharge: 2014-04-20 | Disposition: A | Discharge status: Home / Self Care | Payer: Medicare Other | Source: Ambulatory Visit | Attending: Internal Medicine | Admitting: Internal Medicine

## 2014-04-20 DIAGNOSIS — C8599 Non-Hodgkin lymphoma, unspecified, extranodal and solid organ sites: Secondary | ICD-10-CM

## 2014-04-20 DIAGNOSIS — C8590 Non-Hodgkin lymphoma, unspecified, unspecified site: Secondary | ICD-10-CM | POA: Diagnosis not present

## 2014-04-20 DIAGNOSIS — D334 Benign neoplasm of spinal cord: Secondary | ICD-10-CM | POA: Diagnosis not present

## 2014-04-20 DIAGNOSIS — Z79899 Other long term (current) drug therapy: Secondary | ICD-10-CM | POA: Diagnosis not present

## 2014-04-20 DIAGNOSIS — C8592 Non-Hodgkin lymphoma, unspecified, intrathoracic lymph nodes: Secondary | ICD-10-CM | POA: Diagnosis not present

## 2014-04-20 LAB — GLUCOSE, CAPILLARY: Glucose-Capillary: 80 mg/dL (ref 70–99)

## 2014-04-20 MED ORDER — FLUDEOXYGLUCOSE F - 18 (FDG) INJECTION
7.4000 | Freq: Once | INTRAVENOUS | Status: AC | PRN
  Administered 2014-04-20: 7.4 via INTRAVENOUS

## 2014-04-21 DIAGNOSIS — M549 Dorsalgia, unspecified: Secondary | ICD-10-CM | POA: Diagnosis not present

## 2014-04-21 DIAGNOSIS — C8592 Non-Hodgkin lymphoma, unspecified, intrathoracic lymph nodes: Secondary | ICD-10-CM | POA: Diagnosis not present

## 2014-04-21 DIAGNOSIS — M810 Age-related osteoporosis without current pathological fracture: Secondary | ICD-10-CM | POA: Diagnosis not present

## 2014-04-21 DIAGNOSIS — G8929 Other chronic pain: Secondary | ICD-10-CM | POA: Diagnosis not present

## 2014-04-21 DIAGNOSIS — G629 Polyneuropathy, unspecified: Secondary | ICD-10-CM | POA: Diagnosis not present

## 2014-04-24 DIAGNOSIS — G600 Hereditary motor and sensory neuropathy: Secondary | ICD-10-CM | POA: Diagnosis not present

## 2014-04-24 DIAGNOSIS — M79672 Pain in left foot: Secondary | ICD-10-CM | POA: Diagnosis not present

## 2014-04-24 DIAGNOSIS — G5790 Unspecified mononeuropathy of unspecified lower limb: Secondary | ICD-10-CM | POA: Diagnosis not present

## 2014-04-24 DIAGNOSIS — M79671 Pain in right foot: Secondary | ICD-10-CM | POA: Diagnosis not present

## 2014-04-27 DIAGNOSIS — I6350 Cerebral infarction due to unspecified occlusion or stenosis of unspecified cerebral artery: Secondary | ICD-10-CM | POA: Diagnosis not present

## 2014-04-27 DIAGNOSIS — K210 Gastro-esophageal reflux disease with esophagitis: Secondary | ICD-10-CM | POA: Diagnosis not present

## 2014-04-30 IMAGING — PT NM PET TUM IMG INITIAL (PI) SKULL BASE T - THIGH
6 series · 25 of 25 positions shown · non-contrast
Comparison: CT CHEST-ABD-PELV W/ CM dated 11/16/2012

CLINICAL DATA: Initial treatment strategy for non-Hodgkin's
lymphoma. Patient status post initial chemotherapy and radiation
therapy. No data provided on site of neoplasm.

EXAM:
NUCLEAR MEDICINE PET SKULL BASE TO THIGH
TECHNIQUE: 18.1 mCi F-18 FDG was injected intravenously. CT data was obtained
and used for attenuation correction and anatomic localization only.
(This was not acquired as a diagnostic CT examination.) Additional
exam technical data entered on technologist worksheet.
FASTING BLOOD GLUCOSE:  Value: 93mg/dl

[Series 1: pet nac 2d · axial · 3.3mm · 4.69mm/px · z∈[-918,-48]mm · 5 of 267 slices shown]
[im 1/267]
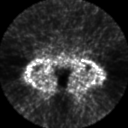
[im 67/267]
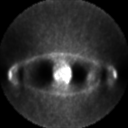
[im 134/267]
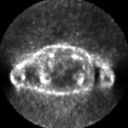
[im 200/267]
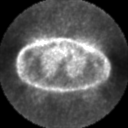
[im 267/267]
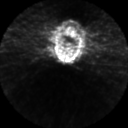

[Series 2: ct slices · axial · 3.8mm · 0.98mm/px · z∈[-918,-48]mm · 6 of 267 slices shown]
[im 1/267]
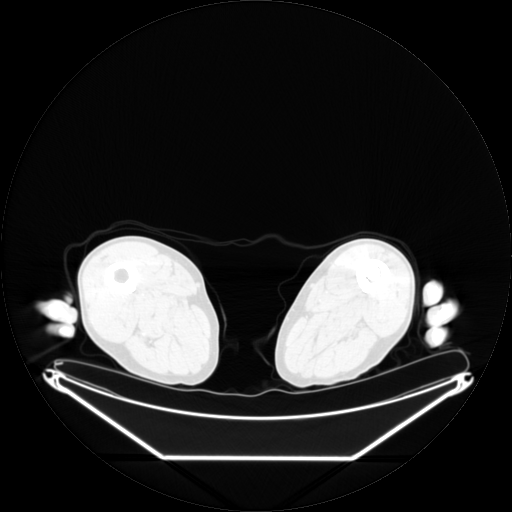
[im 54/267]
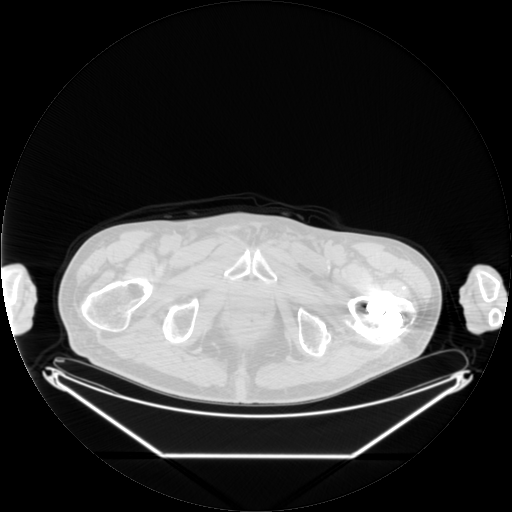
[im 107/267]
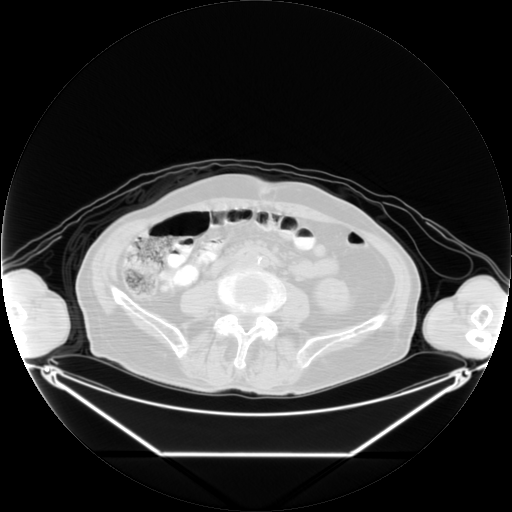
[im 160/267]
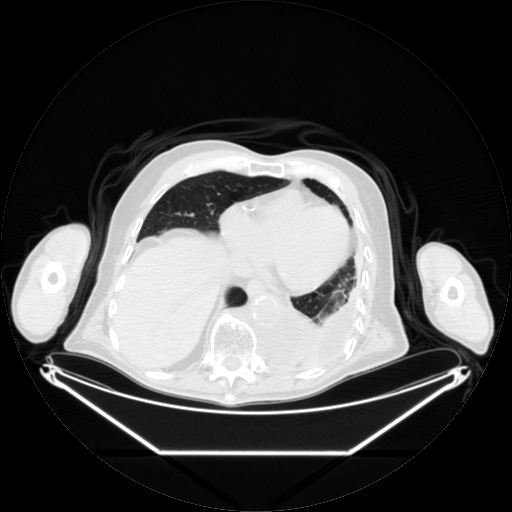
[im 213/267]
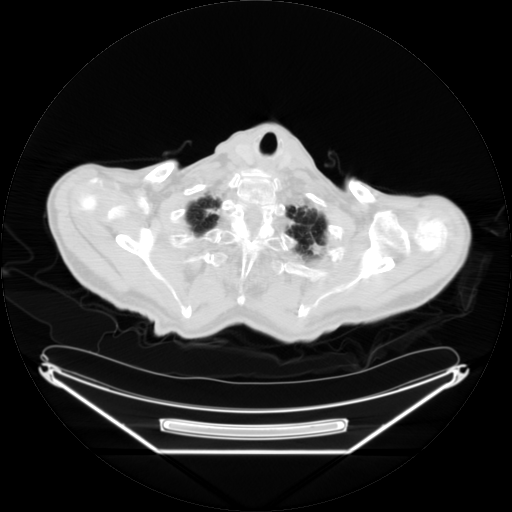
[im 267/267  brain]
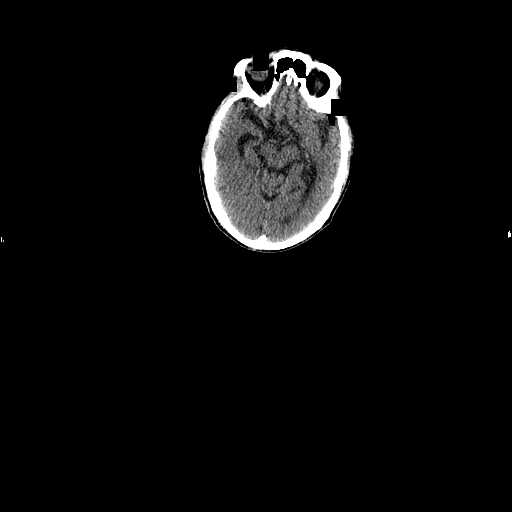

[Series 2: pet ac 2d · axial · 3.3mm · 4.69mm/px · z∈[-918,-48]mm · 6 of 267 slices shown]
[im 1/267]
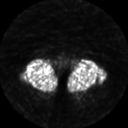
[im 54/267]
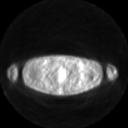
[im 107/267]
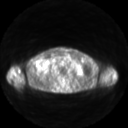
[im 160/267]
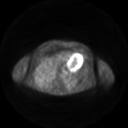
[im 213/267]
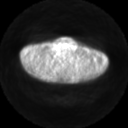
[im 267/267]
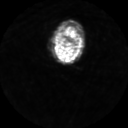

[Series 123: mip · coronal · 3.3mm · 4.69mm/px · 1 of 60 slices shown]
[im 1/60]
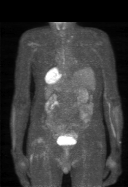

[Series 251: reformatted · axial · 3.3mm · 3.91mm/px · z∈[-918,-48]mm · 5 of 265 slices shown (1 of 2)]
[im 1/265]
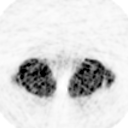
[im 67/265]
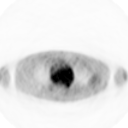
[im 133/265]
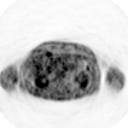
[im 199/265]
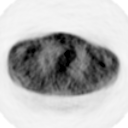
[im 265/265]
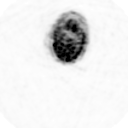

[Series 253: reformatted · coronal · 4.7mm · 6.98mm/px · 2 of 79 slices shown (2 of 2)]
[im 1/79]
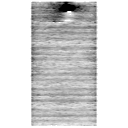
[im 79/79]
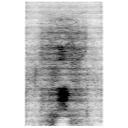

[25 of 25 positions shown; findings below may reference images not displayed]

FINDINGS: NECK

No hypermetabolic lymph nodes in the neck.

CHEST

The soft tissue mass within the right lower lobe which has no
metabolic activity above background blood pool activity. The mass
measures 6.0 x 5.6 cm along the descending thoracic aorta similar to
5.9 x 5.5 cm on prior remeasured. Small loculated fluid collection
in the pleural space adjacent to the or mass.

No hypermetabolic mediastinal lymph nodes. No suspicious pulmonary
nodules.

ABDOMEN/PELVIS

No hypermetabolic lymph nodes in at or pelvis. The spleen is normal
volume normal metabolic activity.

SKELETON

No focal hypermetabolic activity to suggest skeletal metastasis.
IMPRESSION: 1. A complete response to chemotherapy. Left lower lobe mass with
low metabolic activity less than blood pool.
2. No evidence of hypermetabolic adenopathy in the chest, abdomen,
or pelvis.
3. Normal spleen.

## 2014-05-02 DIAGNOSIS — R112 Nausea with vomiting, unspecified: Secondary | ICD-10-CM | POA: Diagnosis not present

## 2014-05-02 DIAGNOSIS — C8590 Non-Hodgkin lymphoma, unspecified, unspecified site: Secondary | ICD-10-CM | POA: Diagnosis not present

## 2014-05-02 DIAGNOSIS — C8592 Non-Hodgkin lymphoma, unspecified, intrathoracic lymph nodes: Secondary | ICD-10-CM | POA: Diagnosis not present

## 2014-05-03 DIAGNOSIS — C8590 Non-Hodgkin lymphoma, unspecified, unspecified site: Secondary | ICD-10-CM | POA: Diagnosis not present

## 2014-05-03 DIAGNOSIS — R112 Nausea with vomiting, unspecified: Secondary | ICD-10-CM | POA: Diagnosis not present

## 2014-05-04 DIAGNOSIS — I10 Essential (primary) hypertension: Secondary | ICD-10-CM | POA: Diagnosis not present

## 2014-05-04 DIAGNOSIS — Z79899 Other long term (current) drug therapy: Secondary | ICD-10-CM | POA: Diagnosis not present

## 2014-05-04 DIAGNOSIS — Z8781 Personal history of (healed) traumatic fracture: Secondary | ICD-10-CM | POA: Diagnosis not present

## 2014-05-04 DIAGNOSIS — C8590 Non-Hodgkin lymphoma, unspecified, unspecified site: Secondary | ICD-10-CM | POA: Diagnosis not present

## 2014-05-04 DIAGNOSIS — C8290 Follicular lymphoma, unspecified, unspecified site: Secondary | ICD-10-CM | POA: Diagnosis not present

## 2014-05-04 DIAGNOSIS — G8929 Other chronic pain: Secondary | ICD-10-CM | POA: Diagnosis not present

## 2014-05-04 DIAGNOSIS — M47816 Spondylosis without myelopathy or radiculopathy, lumbar region: Secondary | ICD-10-CM | POA: Diagnosis not present

## 2014-05-04 DIAGNOSIS — M4806 Spinal stenosis, lumbar region: Secondary | ICD-10-CM | POA: Diagnosis not present

## 2014-05-04 DIAGNOSIS — M5136 Other intervertebral disc degeneration, lumbar region: Secondary | ICD-10-CM | POA: Diagnosis not present

## 2014-05-04 DIAGNOSIS — G629 Polyneuropathy, unspecified: Secondary | ICD-10-CM | POA: Diagnosis not present

## 2014-05-04 DIAGNOSIS — K219 Gastro-esophageal reflux disease without esophagitis: Secondary | ICD-10-CM | POA: Diagnosis not present

## 2014-05-05 DIAGNOSIS — Z95828 Presence of other vascular implants and grafts: Secondary | ICD-10-CM | POA: Diagnosis not present

## 2014-05-05 DIAGNOSIS — M810 Age-related osteoporosis without current pathological fracture: Secondary | ICD-10-CM | POA: Diagnosis not present

## 2014-05-05 DIAGNOSIS — M1990 Unspecified osteoarthritis, unspecified site: Secondary | ICD-10-CM | POA: Diagnosis not present

## 2014-05-05 DIAGNOSIS — M5489 Other dorsalgia: Secondary | ICD-10-CM | POA: Diagnosis not present

## 2014-05-05 DIAGNOSIS — C8590 Non-Hodgkin lymphoma, unspecified, unspecified site: Secondary | ICD-10-CM | POA: Diagnosis not present

## 2014-05-09 DIAGNOSIS — R21 Rash and other nonspecific skin eruption: Secondary | ICD-10-CM | POA: Diagnosis not present

## 2014-05-09 DIAGNOSIS — C8590 Non-Hodgkin lymphoma, unspecified, unspecified site: Secondary | ICD-10-CM | POA: Diagnosis not present

## 2014-05-29 DIAGNOSIS — Z79899 Other long term (current) drug therapy: Secondary | ICD-10-CM | POA: Diagnosis not present

## 2014-05-29 DIAGNOSIS — M4856XA Collapsed vertebra, not elsewhere classified, lumbar region, initial encounter for fracture: Secondary | ICD-10-CM | POA: Diagnosis not present

## 2014-05-29 DIAGNOSIS — M549 Dorsalgia, unspecified: Secondary | ICD-10-CM | POA: Diagnosis not present

## 2014-05-29 DIAGNOSIS — Z7982 Long term (current) use of aspirin: Secondary | ICD-10-CM | POA: Diagnosis not present

## 2014-05-29 DIAGNOSIS — R918 Other nonspecific abnormal finding of lung field: Secondary | ICD-10-CM | POA: Diagnosis not present

## 2014-05-29 DIAGNOSIS — J9811 Atelectasis: Secondary | ICD-10-CM | POA: Diagnosis not present

## 2014-05-29 DIAGNOSIS — Z8673 Personal history of transient ischemic attack (TIA), and cerebral infarction without residual deficits: Secondary | ICD-10-CM | POA: Diagnosis not present

## 2014-05-29 DIAGNOSIS — M47816 Spondylosis without myelopathy or radiculopathy, lumbar region: Secondary | ICD-10-CM | POA: Diagnosis not present

## 2014-05-29 DIAGNOSIS — C8590 Non-Hodgkin lymphoma, unspecified, unspecified site: Secondary | ICD-10-CM | POA: Diagnosis not present

## 2014-05-29 DIAGNOSIS — I252 Old myocardial infarction: Secondary | ICD-10-CM | POA: Diagnosis not present

## 2014-05-31 DIAGNOSIS — S32000A Wedge compression fracture of unspecified lumbar vertebra, initial encounter for closed fracture: Secondary | ICD-10-CM | POA: Diagnosis not present

## 2014-05-31 DIAGNOSIS — C8210 Follicular lymphoma grade II, unspecified site: Secondary | ICD-10-CM | POA: Diagnosis not present

## 2014-05-31 DIAGNOSIS — Z95828 Presence of other vascular implants and grafts: Secondary | ICD-10-CM | POA: Diagnosis not present

## 2014-05-31 DIAGNOSIS — M545 Low back pain: Secondary | ICD-10-CM | POA: Diagnosis not present

## 2014-05-31 DIAGNOSIS — M810 Age-related osteoporosis without current pathological fracture: Secondary | ICD-10-CM | POA: Diagnosis not present

## 2014-06-02 DIAGNOSIS — Z8673 Personal history of transient ischemic attack (TIA), and cerebral infarction without residual deficits: Secondary | ICD-10-CM | POA: Diagnosis not present

## 2014-06-02 DIAGNOSIS — K219 Gastro-esophageal reflux disease without esophagitis: Secondary | ICD-10-CM | POA: Diagnosis not present

## 2014-06-02 DIAGNOSIS — Z79891 Long term (current) use of opiate analgesic: Secondary | ICD-10-CM | POA: Diagnosis not present

## 2014-06-02 DIAGNOSIS — I252 Old myocardial infarction: Secondary | ICD-10-CM | POA: Diagnosis not present

## 2014-06-02 DIAGNOSIS — Z7982 Long term (current) use of aspirin: Secondary | ICD-10-CM | POA: Diagnosis not present

## 2014-06-02 DIAGNOSIS — S32059A Unspecified fracture of fifth lumbar vertebra, initial encounter for closed fracture: Secondary | ICD-10-CM | POA: Diagnosis not present

## 2014-06-02 DIAGNOSIS — M810 Age-related osteoporosis without current pathological fracture: Secondary | ICD-10-CM | POA: Diagnosis not present

## 2014-06-02 DIAGNOSIS — Z79899 Other long term (current) drug therapy: Secondary | ICD-10-CM | POA: Diagnosis not present

## 2014-06-02 DIAGNOSIS — C9190 Lymphoid leukemia, unspecified not having achieved remission: Secondary | ICD-10-CM | POA: Diagnosis not present

## 2014-06-05 DIAGNOSIS — K219 Gastro-esophageal reflux disease without esophagitis: Secondary | ICD-10-CM | POA: Diagnosis not present

## 2014-06-05 DIAGNOSIS — Z79891 Long term (current) use of opiate analgesic: Secondary | ICD-10-CM | POA: Diagnosis not present

## 2014-06-05 DIAGNOSIS — C9190 Lymphoid leukemia, unspecified not having achieved remission: Secondary | ICD-10-CM | POA: Diagnosis not present

## 2014-06-05 DIAGNOSIS — M4850XA Collapsed vertebra, not elsewhere classified, site unspecified, initial encounter for fracture: Secondary | ICD-10-CM | POA: Diagnosis not present

## 2014-06-05 DIAGNOSIS — Z8673 Personal history of transient ischemic attack (TIA), and cerebral infarction without residual deficits: Secondary | ICD-10-CM | POA: Diagnosis not present

## 2014-06-05 DIAGNOSIS — S32059A Unspecified fracture of fifth lumbar vertebra, initial encounter for closed fracture: Secondary | ICD-10-CM | POA: Diagnosis not present

## 2014-06-05 DIAGNOSIS — Z7982 Long term (current) use of aspirin: Secondary | ICD-10-CM | POA: Diagnosis not present

## 2014-06-05 DIAGNOSIS — I252 Old myocardial infarction: Secondary | ICD-10-CM | POA: Diagnosis not present

## 2014-06-05 DIAGNOSIS — S32050A Wedge compression fracture of fifth lumbar vertebra, initial encounter for closed fracture: Secondary | ICD-10-CM | POA: Diagnosis not present

## 2014-06-05 DIAGNOSIS — M810 Age-related osteoporosis without current pathological fracture: Secondary | ICD-10-CM | POA: Diagnosis not present

## 2014-06-05 DIAGNOSIS — Z79899 Other long term (current) drug therapy: Secondary | ICD-10-CM | POA: Diagnosis not present

## 2014-06-06 DIAGNOSIS — S32059A Unspecified fracture of fifth lumbar vertebra, initial encounter for closed fracture: Secondary | ICD-10-CM | POA: Diagnosis not present

## 2014-06-06 DIAGNOSIS — K219 Gastro-esophageal reflux disease without esophagitis: Secondary | ICD-10-CM | POA: Diagnosis not present

## 2014-06-06 DIAGNOSIS — I252 Old myocardial infarction: Secondary | ICD-10-CM | POA: Diagnosis not present

## 2014-06-06 DIAGNOSIS — M810 Age-related osteoporosis without current pathological fracture: Secondary | ICD-10-CM | POA: Diagnosis not present

## 2014-06-06 DIAGNOSIS — Z8673 Personal history of transient ischemic attack (TIA), and cerebral infarction without residual deficits: Secondary | ICD-10-CM | POA: Diagnosis not present

## 2014-06-06 DIAGNOSIS — C9190 Lymphoid leukemia, unspecified not having achieved remission: Secondary | ICD-10-CM | POA: Diagnosis not present

## 2014-06-12 DIAGNOSIS — M47816 Spondylosis without myelopathy or radiculopathy, lumbar region: Secondary | ICD-10-CM | POA: Diagnosis not present

## 2014-06-12 DIAGNOSIS — Z981 Arthrodesis status: Secondary | ICD-10-CM | POA: Diagnosis not present

## 2014-06-12 DIAGNOSIS — M4856XD Collapsed vertebra, not elsewhere classified, lumbar region, subsequent encounter for fracture with routine healing: Secondary | ICD-10-CM | POA: Diagnosis not present

## 2014-06-12 DIAGNOSIS — M5136 Other intervertebral disc degeneration, lumbar region: Secondary | ICD-10-CM | POA: Diagnosis not present

## 2014-06-12 DIAGNOSIS — S32000A Wedge compression fracture of unspecified lumbar vertebra, initial encounter for closed fracture: Secondary | ICD-10-CM | POA: Diagnosis not present

## 2014-06-12 DIAGNOSIS — Z9889 Other specified postprocedural states: Secondary | ICD-10-CM | POA: Diagnosis not present

## 2014-06-19 DIAGNOSIS — M7989 Other specified soft tissue disorders: Secondary | ICD-10-CM | POA: Diagnosis not present

## 2014-06-19 DIAGNOSIS — M549 Dorsalgia, unspecified: Secondary | ICD-10-CM | POA: Diagnosis not present

## 2014-06-19 DIAGNOSIS — M4855XD Collapsed vertebra, not elsewhere classified, thoracolumbar region, subsequent encounter for fracture with routine healing: Secondary | ICD-10-CM | POA: Diagnosis not present

## 2014-06-19 DIAGNOSIS — I252 Old myocardial infarction: Secondary | ICD-10-CM | POA: Diagnosis not present

## 2014-06-19 DIAGNOSIS — R109 Unspecified abdominal pain: Secondary | ICD-10-CM | POA: Diagnosis not present

## 2014-06-19 DIAGNOSIS — Z981 Arthrodesis status: Secondary | ICD-10-CM | POA: Diagnosis not present

## 2014-06-19 DIAGNOSIS — G8929 Other chronic pain: Secondary | ICD-10-CM | POA: Diagnosis not present

## 2014-06-19 DIAGNOSIS — Z7982 Long term (current) use of aspirin: Secondary | ICD-10-CM | POA: Diagnosis not present

## 2014-06-19 DIAGNOSIS — Z8673 Personal history of transient ischemic attack (TIA), and cerebral infarction without residual deficits: Secondary | ICD-10-CM | POA: Diagnosis not present

## 2014-06-19 DIAGNOSIS — M4856XD Collapsed vertebra, not elsewhere classified, lumbar region, subsequent encounter for fracture with routine healing: Secondary | ICD-10-CM | POA: Diagnosis not present

## 2014-06-19 DIAGNOSIS — Z85028 Personal history of other malignant neoplasm of stomach: Secondary | ICD-10-CM | POA: Diagnosis not present

## 2014-06-19 DIAGNOSIS — Z79899 Other long term (current) drug therapy: Secondary | ICD-10-CM | POA: Diagnosis not present

## 2014-06-19 DIAGNOSIS — Z9889 Other specified postprocedural states: Secondary | ICD-10-CM | POA: Diagnosis not present

## 2014-06-19 DIAGNOSIS — C3490 Malignant neoplasm of unspecified part of unspecified bronchus or lung: Secondary | ICD-10-CM | POA: Diagnosis not present

## 2014-06-19 DIAGNOSIS — Z8572 Personal history of non-Hodgkin lymphomas: Secondary | ICD-10-CM | POA: Diagnosis not present

## 2014-06-19 DIAGNOSIS — Z8701 Personal history of pneumonia (recurrent): Secondary | ICD-10-CM | POA: Diagnosis not present

## 2014-06-19 DIAGNOSIS — M545 Low back pain: Secondary | ICD-10-CM | POA: Diagnosis not present

## 2014-07-06 DIAGNOSIS — M5440 Lumbago with sciatica, unspecified side: Secondary | ICD-10-CM | POA: Diagnosis not present

## 2014-07-06 DIAGNOSIS — C8210 Follicular lymphoma grade II, unspecified site: Secondary | ICD-10-CM | POA: Diagnosis not present

## 2014-07-06 DIAGNOSIS — Z95828 Presence of other vascular implants and grafts: Secondary | ICD-10-CM | POA: Diagnosis not present

## 2014-07-06 DIAGNOSIS — G629 Polyneuropathy, unspecified: Secondary | ICD-10-CM | POA: Diagnosis not present

## 2014-07-06 DIAGNOSIS — M810 Age-related osteoporosis without current pathological fracture: Secondary | ICD-10-CM | POA: Diagnosis not present

## 2014-07-06 DIAGNOSIS — C8280 Other types of follicular lymphoma, unspecified site: Secondary | ICD-10-CM | POA: Diagnosis not present

## 2014-07-06 DIAGNOSIS — S32000A Wedge compression fracture of unspecified lumbar vertebra, initial encounter for closed fracture: Secondary | ICD-10-CM | POA: Diagnosis not present

## 2014-07-06 DIAGNOSIS — M5136 Other intervertebral disc degeneration, lumbar region: Secondary | ICD-10-CM | POA: Diagnosis not present

## 2014-07-06 DIAGNOSIS — M1990 Unspecified osteoarthritis, unspecified site: Secondary | ICD-10-CM | POA: Diagnosis not present

## 2014-07-12 DIAGNOSIS — R1310 Dysphagia, unspecified: Secondary | ICD-10-CM | POA: Diagnosis not present

## 2014-07-19 DIAGNOSIS — Z7982 Long term (current) use of aspirin: Secondary | ICD-10-CM | POA: Diagnosis not present

## 2014-07-19 DIAGNOSIS — Z85118 Personal history of other malignant neoplasm of bronchus and lung: Secondary | ICD-10-CM | POA: Diagnosis not present

## 2014-07-19 DIAGNOSIS — C801 Malignant (primary) neoplasm, unspecified: Secondary | ICD-10-CM | POA: Diagnosis not present

## 2014-07-19 DIAGNOSIS — C7900 Secondary malignant neoplasm of unspecified kidney and renal pelvis: Secondary | ICD-10-CM | POA: Diagnosis not present

## 2014-07-19 DIAGNOSIS — Z79899 Other long term (current) drug therapy: Secondary | ICD-10-CM | POA: Diagnosis not present

## 2014-07-19 DIAGNOSIS — R918 Other nonspecific abnormal finding of lung field: Secondary | ICD-10-CM | POA: Diagnosis not present

## 2014-07-19 DIAGNOSIS — M5440 Lumbago with sciatica, unspecified side: Secondary | ICD-10-CM | POA: Diagnosis not present

## 2014-07-19 DIAGNOSIS — Z800 Family history of malignant neoplasm of digestive organs: Secondary | ICD-10-CM | POA: Diagnosis not present

## 2014-07-19 DIAGNOSIS — K449 Diaphragmatic hernia without obstruction or gangrene: Secondary | ICD-10-CM | POA: Diagnosis not present

## 2014-07-19 DIAGNOSIS — M1990 Unspecified osteoarthritis, unspecified site: Secondary | ICD-10-CM | POA: Diagnosis not present

## 2014-07-19 DIAGNOSIS — G629 Polyneuropathy, unspecified: Secondary | ICD-10-CM | POA: Diagnosis not present

## 2014-07-19 DIAGNOSIS — C8280 Other types of follicular lymphoma, unspecified site: Secondary | ICD-10-CM | POA: Diagnosis not present

## 2014-07-19 DIAGNOSIS — C763 Malignant neoplasm of pelvis: Secondary | ICD-10-CM | POA: Diagnosis not present

## 2014-07-19 DIAGNOSIS — R1310 Dysphagia, unspecified: Secondary | ICD-10-CM | POA: Diagnosis not present

## 2014-07-19 DIAGNOSIS — K219 Gastro-esophageal reflux disease without esophagitis: Secondary | ICD-10-CM | POA: Diagnosis not present

## 2014-07-19 DIAGNOSIS — Z8673 Personal history of transient ischemic attack (TIA), and cerebral infarction without residual deficits: Secondary | ICD-10-CM | POA: Diagnosis not present

## 2014-07-19 DIAGNOSIS — K222 Esophageal obstruction: Secondary | ICD-10-CM | POA: Diagnosis not present

## 2014-07-19 DIAGNOSIS — I252 Old myocardial infarction: Secondary | ICD-10-CM | POA: Diagnosis not present

## 2014-07-19 DIAGNOSIS — Z8572 Personal history of non-Hodgkin lymphomas: Secondary | ICD-10-CM | POA: Diagnosis not present

## 2014-07-19 DIAGNOSIS — G620 Drug-induced polyneuropathy: Secondary | ICD-10-CM | POA: Diagnosis not present

## 2014-07-25 DIAGNOSIS — M545 Low back pain: Secondary | ICD-10-CM | POA: Diagnosis not present

## 2014-07-25 DIAGNOSIS — R102 Pelvic and perineal pain: Secondary | ICD-10-CM | POA: Diagnosis not present

## 2014-07-25 DIAGNOSIS — Z9889 Other specified postprocedural states: Secondary | ICD-10-CM | POA: Diagnosis not present

## 2014-07-25 DIAGNOSIS — Z8781 Personal history of (healed) traumatic fracture: Secondary | ICD-10-CM | POA: Diagnosis not present

## 2014-07-28 DIAGNOSIS — G603 Idiopathic progressive neuropathy: Secondary | ICD-10-CM | POA: Diagnosis not present

## 2014-07-28 DIAGNOSIS — K210 Gastro-esophageal reflux disease with esophagitis: Secondary | ICD-10-CM | POA: Diagnosis not present

## 2014-09-17 DIAGNOSIS — H6123 Impacted cerumen, bilateral: Secondary | ICD-10-CM | POA: Diagnosis not present

## 2014-09-20 DIAGNOSIS — I252 Old myocardial infarction: Secondary | ICD-10-CM | POA: Diagnosis not present

## 2014-09-20 DIAGNOSIS — I639 Cerebral infarction, unspecified: Secondary | ICD-10-CM | POA: Diagnosis not present

## 2014-09-20 DIAGNOSIS — M5136 Other intervertebral disc degeneration, lumbar region: Secondary | ICD-10-CM | POA: Diagnosis not present

## 2014-09-20 DIAGNOSIS — C8290 Follicular lymphoma, unspecified, unspecified site: Secondary | ICD-10-CM | POA: Diagnosis not present

## 2014-09-20 DIAGNOSIS — M810 Age-related osteoporosis without current pathological fracture: Secondary | ICD-10-CM | POA: Diagnosis not present

## 2014-09-20 DIAGNOSIS — M47816 Spondylosis without myelopathy or radiculopathy, lumbar region: Secondary | ICD-10-CM | POA: Diagnosis not present

## 2014-09-20 DIAGNOSIS — C8280 Other types of follicular lymphoma, unspecified site: Secondary | ICD-10-CM | POA: Diagnosis not present

## 2014-09-20 DIAGNOSIS — R918 Other nonspecific abnormal finding of lung field: Secondary | ICD-10-CM | POA: Diagnosis not present

## 2014-09-20 DIAGNOSIS — M1990 Unspecified osteoarthritis, unspecified site: Secondary | ICD-10-CM | POA: Diagnosis not present

## 2014-09-20 DIAGNOSIS — Z8572 Personal history of non-Hodgkin lymphomas: Secondary | ICD-10-CM | POA: Diagnosis not present

## 2014-09-20 DIAGNOSIS — G620 Drug-induced polyneuropathy: Secondary | ICD-10-CM | POA: Diagnosis not present

## 2014-09-20 DIAGNOSIS — M545 Low back pain: Secondary | ICD-10-CM | POA: Diagnosis not present

## 2014-10-19 DIAGNOSIS — G8929 Other chronic pain: Secondary | ICD-10-CM | POA: Diagnosis not present

## 2014-10-19 DIAGNOSIS — G629 Polyneuropathy, unspecified: Secondary | ICD-10-CM | POA: Diagnosis not present

## 2014-10-19 DIAGNOSIS — M47816 Spondylosis without myelopathy or radiculopathy, lumbar region: Secondary | ICD-10-CM | POA: Diagnosis not present

## 2014-10-19 DIAGNOSIS — C8290 Follicular lymphoma, unspecified, unspecified site: Secondary | ICD-10-CM | POA: Diagnosis not present

## 2014-10-19 DIAGNOSIS — I10 Essential (primary) hypertension: Secondary | ICD-10-CM | POA: Diagnosis not present

## 2014-10-19 DIAGNOSIS — K219 Gastro-esophageal reflux disease without esophagitis: Secondary | ICD-10-CM | POA: Diagnosis not present

## 2014-10-19 DIAGNOSIS — M545 Low back pain: Secondary | ICD-10-CM | POA: Diagnosis not present

## 2014-10-19 DIAGNOSIS — M5136 Other intervertebral disc degeneration, lumbar region: Secondary | ICD-10-CM | POA: Diagnosis not present

## 2014-10-19 DIAGNOSIS — M4806 Spinal stenosis, lumbar region: Secondary | ICD-10-CM | POA: Diagnosis not present

## 2014-10-19 DIAGNOSIS — Z79899 Other long term (current) drug therapy: Secondary | ICD-10-CM | POA: Diagnosis not present

## 2014-11-02 DIAGNOSIS — G629 Polyneuropathy, unspecified: Secondary | ICD-10-CM | POA: Diagnosis not present

## 2014-11-02 DIAGNOSIS — K210 Gastro-esophageal reflux disease with esophagitis: Secondary | ICD-10-CM | POA: Diagnosis not present

## 2014-11-02 DIAGNOSIS — Z13220 Encounter for screening for lipoid disorders: Secondary | ICD-10-CM | POA: Diagnosis not present

## 2014-11-02 DIAGNOSIS — C8590 Non-Hodgkin lymphoma, unspecified, unspecified site: Secondary | ICD-10-CM | POA: Diagnosis not present

## 2014-11-20 DIAGNOSIS — K219 Gastro-esophageal reflux disease without esophagitis: Secondary | ICD-10-CM | POA: Diagnosis not present

## 2014-11-20 DIAGNOSIS — Z79899 Other long term (current) drug therapy: Secondary | ICD-10-CM | POA: Diagnosis not present

## 2014-11-20 DIAGNOSIS — I10 Essential (primary) hypertension: Secondary | ICD-10-CM | POA: Diagnosis not present

## 2014-11-20 DIAGNOSIS — C8290 Follicular lymphoma, unspecified, unspecified site: Secondary | ICD-10-CM | POA: Diagnosis not present

## 2014-11-20 DIAGNOSIS — M47816 Spondylosis without myelopathy or radiculopathy, lumbar region: Secondary | ICD-10-CM | POA: Diagnosis not present

## 2014-11-20 DIAGNOSIS — G629 Polyneuropathy, unspecified: Secondary | ICD-10-CM | POA: Diagnosis not present

## 2014-11-20 DIAGNOSIS — M5136 Other intervertebral disc degeneration, lumbar region: Secondary | ICD-10-CM | POA: Diagnosis not present

## 2014-11-20 DIAGNOSIS — G8929 Other chronic pain: Secondary | ICD-10-CM | POA: Diagnosis not present

## 2014-12-22 DIAGNOSIS — F17220 Nicotine dependence, chewing tobacco, uncomplicated: Secondary | ICD-10-CM | POA: Diagnosis not present

## 2014-12-22 DIAGNOSIS — G8929 Other chronic pain: Secondary | ICD-10-CM | POA: Diagnosis not present

## 2014-12-22 DIAGNOSIS — Z8781 Personal history of (healed) traumatic fracture: Secondary | ICD-10-CM | POA: Diagnosis not present

## 2014-12-22 DIAGNOSIS — I10 Essential (primary) hypertension: Secondary | ICD-10-CM | POA: Diagnosis not present

## 2014-12-22 DIAGNOSIS — Z79899 Other long term (current) drug therapy: Secondary | ICD-10-CM | POA: Diagnosis not present

## 2014-12-22 DIAGNOSIS — M4806 Spinal stenosis, lumbar region: Secondary | ICD-10-CM | POA: Diagnosis not present

## 2014-12-22 DIAGNOSIS — M47816 Spondylosis without myelopathy or radiculopathy, lumbar region: Secondary | ICD-10-CM | POA: Diagnosis not present

## 2014-12-22 DIAGNOSIS — K219 Gastro-esophageal reflux disease without esophagitis: Secondary | ICD-10-CM | POA: Diagnosis not present

## 2014-12-22 DIAGNOSIS — M545 Low back pain: Secondary | ICD-10-CM | POA: Diagnosis not present

## 2014-12-22 DIAGNOSIS — M5136 Other intervertebral disc degeneration, lumbar region: Secondary | ICD-10-CM | POA: Diagnosis not present

## 2014-12-22 DIAGNOSIS — Z8572 Personal history of non-Hodgkin lymphomas: Secondary | ICD-10-CM | POA: Diagnosis not present

## 2014-12-22 DIAGNOSIS — G629 Polyneuropathy, unspecified: Secondary | ICD-10-CM | POA: Diagnosis not present

## 2015-01-31 DIAGNOSIS — M47816 Spondylosis without myelopathy or radiculopathy, lumbar region: Secondary | ICD-10-CM | POA: Diagnosis not present

## 2015-01-31 DIAGNOSIS — M5136 Other intervertebral disc degeneration, lumbar region: Secondary | ICD-10-CM | POA: Diagnosis not present

## 2015-01-31 DIAGNOSIS — C8290 Follicular lymphoma, unspecified, unspecified site: Secondary | ICD-10-CM | POA: Diagnosis not present

## 2015-01-31 DIAGNOSIS — M4806 Spinal stenosis, lumbar region: Secondary | ICD-10-CM | POA: Diagnosis not present

## 2015-02-02 DIAGNOSIS — Z0000 Encounter for general adult medical examination without abnormal findings: Secondary | ICD-10-CM | POA: Diagnosis not present

## 2015-02-02 DIAGNOSIS — Z1389 Encounter for screening for other disorder: Secondary | ICD-10-CM | POA: Diagnosis not present

## 2015-02-02 DIAGNOSIS — K210 Gastro-esophageal reflux disease with esophagitis: Secondary | ICD-10-CM | POA: Diagnosis not present

## 2015-02-02 DIAGNOSIS — N4289 Other specified disorders of prostate: Secondary | ICD-10-CM | POA: Diagnosis not present

## 2015-02-02 DIAGNOSIS — J410 Simple chronic bronchitis: Secondary | ICD-10-CM | POA: Diagnosis not present

## 2015-02-06 DIAGNOSIS — M47816 Spondylosis without myelopathy or radiculopathy, lumbar region: Secondary | ICD-10-CM | POA: Diagnosis not present

## 2015-02-06 DIAGNOSIS — G629 Polyneuropathy, unspecified: Secondary | ICD-10-CM | POA: Diagnosis not present

## 2015-02-06 DIAGNOSIS — Z7982 Long term (current) use of aspirin: Secondary | ICD-10-CM | POA: Diagnosis not present

## 2015-02-06 DIAGNOSIS — M4806 Spinal stenosis, lumbar region: Secondary | ICD-10-CM | POA: Diagnosis not present

## 2015-02-06 DIAGNOSIS — M5136 Other intervertebral disc degeneration, lumbar region: Secondary | ICD-10-CM | POA: Diagnosis not present

## 2015-02-06 DIAGNOSIS — M545 Low back pain: Secondary | ICD-10-CM | POA: Diagnosis not present

## 2015-02-06 DIAGNOSIS — K219 Gastro-esophageal reflux disease without esophagitis: Secondary | ICD-10-CM | POA: Diagnosis not present

## 2015-02-06 DIAGNOSIS — Z8781 Personal history of (healed) traumatic fracture: Secondary | ICD-10-CM | POA: Diagnosis not present

## 2015-02-06 DIAGNOSIS — Z79899 Other long term (current) drug therapy: Secondary | ICD-10-CM | POA: Diagnosis not present

## 2015-02-06 DIAGNOSIS — C8290 Follicular lymphoma, unspecified, unspecified site: Secondary | ICD-10-CM | POA: Diagnosis not present

## 2015-02-06 DIAGNOSIS — I10 Essential (primary) hypertension: Secondary | ICD-10-CM | POA: Diagnosis not present

## 2015-03-14 DIAGNOSIS — M5136 Other intervertebral disc degeneration, lumbar region: Secondary | ICD-10-CM | POA: Diagnosis not present

## 2015-03-14 DIAGNOSIS — M47816 Spondylosis without myelopathy or radiculopathy, lumbar region: Secondary | ICD-10-CM | POA: Diagnosis not present

## 2015-03-14 DIAGNOSIS — M4806 Spinal stenosis, lumbar region: Secondary | ICD-10-CM | POA: Diagnosis not present

## 2015-03-14 DIAGNOSIS — C8290 Follicular lymphoma, unspecified, unspecified site: Secondary | ICD-10-CM | POA: Diagnosis not present

## 2015-03-19 DIAGNOSIS — M545 Low back pain: Secondary | ICD-10-CM | POA: Diagnosis not present

## 2015-03-19 DIAGNOSIS — M4716 Other spondylosis with myelopathy, lumbar region: Secondary | ICD-10-CM | POA: Diagnosis not present

## 2015-03-19 DIAGNOSIS — R293 Abnormal posture: Secondary | ICD-10-CM | POA: Diagnosis not present

## 2015-03-19 DIAGNOSIS — R2689 Other abnormalities of gait and mobility: Secondary | ICD-10-CM | POA: Diagnosis not present

## 2015-03-19 DIAGNOSIS — R262 Difficulty in walking, not elsewhere classified: Secondary | ICD-10-CM | POA: Diagnosis not present

## 2015-03-22 DIAGNOSIS — M545 Low back pain: Secondary | ICD-10-CM | POA: Diagnosis not present

## 2015-03-22 DIAGNOSIS — R262 Difficulty in walking, not elsewhere classified: Secondary | ICD-10-CM | POA: Diagnosis not present

## 2015-03-22 DIAGNOSIS — M4716 Other spondylosis with myelopathy, lumbar region: Secondary | ICD-10-CM | POA: Diagnosis not present

## 2015-03-22 DIAGNOSIS — R2689 Other abnormalities of gait and mobility: Secondary | ICD-10-CM | POA: Diagnosis not present

## 2015-03-22 DIAGNOSIS — R293 Abnormal posture: Secondary | ICD-10-CM | POA: Diagnosis not present

## 2015-03-28 DIAGNOSIS — R2689 Other abnormalities of gait and mobility: Secondary | ICD-10-CM | POA: Diagnosis not present

## 2015-03-28 DIAGNOSIS — M4716 Other spondylosis with myelopathy, lumbar region: Secondary | ICD-10-CM | POA: Diagnosis not present

## 2015-03-28 DIAGNOSIS — R293 Abnormal posture: Secondary | ICD-10-CM | POA: Diagnosis not present

## 2015-03-28 DIAGNOSIS — M545 Low back pain: Secondary | ICD-10-CM | POA: Diagnosis not present

## 2015-03-28 DIAGNOSIS — R262 Difficulty in walking, not elsewhere classified: Secondary | ICD-10-CM | POA: Diagnosis not present

## 2015-03-30 DIAGNOSIS — M4716 Other spondylosis with myelopathy, lumbar region: Secondary | ICD-10-CM | POA: Diagnosis not present

## 2015-03-30 DIAGNOSIS — M545 Low back pain: Secondary | ICD-10-CM | POA: Diagnosis not present

## 2015-03-30 DIAGNOSIS — R262 Difficulty in walking, not elsewhere classified: Secondary | ICD-10-CM | POA: Diagnosis not present

## 2015-03-30 DIAGNOSIS — R2689 Other abnormalities of gait and mobility: Secondary | ICD-10-CM | POA: Diagnosis not present

## 2015-03-30 DIAGNOSIS — R293 Abnormal posture: Secondary | ICD-10-CM | POA: Diagnosis not present

## 2015-04-09 DIAGNOSIS — J90 Pleural effusion, not elsewhere classified: Secondary | ICD-10-CM | POA: Diagnosis not present

## 2015-04-09 DIAGNOSIS — R590 Localized enlarged lymph nodes: Secondary | ICD-10-CM | POA: Diagnosis not present

## 2015-04-09 DIAGNOSIS — R911 Solitary pulmonary nodule: Secondary | ICD-10-CM | POA: Diagnosis not present

## 2015-04-09 DIAGNOSIS — R918 Other nonspecific abnormal finding of lung field: Secondary | ICD-10-CM | POA: Diagnosis not present

## 2015-04-09 DIAGNOSIS — C8280 Other types of follicular lymphoma, unspecified site: Secondary | ICD-10-CM | POA: Diagnosis not present

## 2015-04-10 DIAGNOSIS — G5790 Unspecified mononeuropathy of unspecified lower limb: Secondary | ICD-10-CM | POA: Diagnosis not present

## 2015-04-10 DIAGNOSIS — G600 Hereditary motor and sensory neuropathy: Secondary | ICD-10-CM | POA: Diagnosis not present

## 2015-04-10 DIAGNOSIS — M79672 Pain in left foot: Secondary | ICD-10-CM | POA: Diagnosis not present

## 2015-04-10 DIAGNOSIS — M79671 Pain in right foot: Secondary | ICD-10-CM | POA: Diagnosis not present

## 2015-04-11 DIAGNOSIS — C8280 Other types of follicular lymphoma, unspecified site: Secondary | ICD-10-CM | POA: Diagnosis not present

## 2015-04-11 DIAGNOSIS — G8929 Other chronic pain: Secondary | ICD-10-CM | POA: Diagnosis not present

## 2015-04-11 DIAGNOSIS — M545 Low back pain: Secondary | ICD-10-CM | POA: Diagnosis not present

## 2015-04-24 DIAGNOSIS — G629 Polyneuropathy, unspecified: Secondary | ICD-10-CM | POA: Diagnosis not present

## 2015-04-24 DIAGNOSIS — M47816 Spondylosis without myelopathy or radiculopathy, lumbar region: Secondary | ICD-10-CM | POA: Diagnosis not present

## 2015-04-24 DIAGNOSIS — M5136 Other intervertebral disc degeneration, lumbar region: Secondary | ICD-10-CM | POA: Diagnosis not present

## 2015-05-10 DIAGNOSIS — J440 Chronic obstructive pulmonary disease with acute lower respiratory infection: Secondary | ICD-10-CM | POA: Diagnosis not present

## 2015-05-10 DIAGNOSIS — K210 Gastro-esophageal reflux disease with esophagitis: Secondary | ICD-10-CM | POA: Diagnosis not present

## 2015-06-11 DIAGNOSIS — G600 Hereditary motor and sensory neuropathy: Secondary | ICD-10-CM | POA: Diagnosis not present

## 2015-06-11 DIAGNOSIS — M79671 Pain in right foot: Secondary | ICD-10-CM | POA: Diagnosis not present

## 2015-06-11 DIAGNOSIS — M79672 Pain in left foot: Secondary | ICD-10-CM | POA: Diagnosis not present

## 2015-06-11 DIAGNOSIS — G5790 Unspecified mononeuropathy of unspecified lower limb: Secondary | ICD-10-CM | POA: Diagnosis not present

## 2015-06-13 DIAGNOSIS — M4806 Spinal stenosis, lumbar region: Secondary | ICD-10-CM | POA: Diagnosis not present

## 2015-06-13 DIAGNOSIS — M5136 Other intervertebral disc degeneration, lumbar region: Secondary | ICD-10-CM | POA: Diagnosis not present

## 2015-06-13 DIAGNOSIS — M47816 Spondylosis without myelopathy or radiculopathy, lumbar region: Secondary | ICD-10-CM | POA: Diagnosis not present

## 2015-06-13 DIAGNOSIS — G629 Polyneuropathy, unspecified: Secondary | ICD-10-CM | POA: Diagnosis not present

## 2015-06-26 DIAGNOSIS — I10 Essential (primary) hypertension: Secondary | ICD-10-CM | POA: Diagnosis not present

## 2015-06-26 DIAGNOSIS — F17220 Nicotine dependence, chewing tobacco, uncomplicated: Secondary | ICD-10-CM | POA: Diagnosis not present

## 2015-06-26 DIAGNOSIS — Z79899 Other long term (current) drug therapy: Secondary | ICD-10-CM | POA: Diagnosis not present

## 2015-06-26 DIAGNOSIS — M47816 Spondylosis without myelopathy or radiculopathy, lumbar region: Secondary | ICD-10-CM | POA: Diagnosis not present

## 2015-06-26 DIAGNOSIS — Z7982 Long term (current) use of aspirin: Secondary | ICD-10-CM | POA: Diagnosis not present

## 2015-06-26 DIAGNOSIS — M5136 Other intervertebral disc degeneration, lumbar region: Secondary | ICD-10-CM | POA: Diagnosis not present

## 2015-06-26 DIAGNOSIS — K219 Gastro-esophageal reflux disease without esophagitis: Secondary | ICD-10-CM | POA: Diagnosis not present

## 2015-06-26 DIAGNOSIS — G629 Polyneuropathy, unspecified: Secondary | ICD-10-CM | POA: Diagnosis not present

## 2015-06-26 DIAGNOSIS — M4806 Spinal stenosis, lumbar region: Secondary | ICD-10-CM | POA: Diagnosis not present

## 2015-06-26 DIAGNOSIS — C8290 Follicular lymphoma, unspecified, unspecified site: Secondary | ICD-10-CM | POA: Diagnosis not present

## 2015-07-02 DIAGNOSIS — G600 Hereditary motor and sensory neuropathy: Secondary | ICD-10-CM | POA: Diagnosis not present

## 2015-07-02 DIAGNOSIS — M79671 Pain in right foot: Secondary | ICD-10-CM | POA: Diagnosis not present

## 2015-07-02 DIAGNOSIS — G5790 Unspecified mononeuropathy of unspecified lower limb: Secondary | ICD-10-CM | POA: Diagnosis not present

## 2015-07-02 DIAGNOSIS — M79672 Pain in left foot: Secondary | ICD-10-CM | POA: Diagnosis not present

## 2015-07-16 DIAGNOSIS — Z85118 Personal history of other malignant neoplasm of bronchus and lung: Secondary | ICD-10-CM | POA: Diagnosis not present

## 2015-07-16 DIAGNOSIS — K219 Gastro-esophageal reflux disease without esophagitis: Secondary | ICD-10-CM | POA: Diagnosis not present

## 2015-07-16 DIAGNOSIS — M545 Low back pain: Secondary | ICD-10-CM | POA: Diagnosis not present

## 2015-07-16 DIAGNOSIS — Z7982 Long term (current) use of aspirin: Secondary | ICD-10-CM | POA: Diagnosis not present

## 2015-07-16 DIAGNOSIS — G629 Polyneuropathy, unspecified: Secondary | ICD-10-CM | POA: Diagnosis not present

## 2015-07-16 DIAGNOSIS — C8590 Non-Hodgkin lymphoma, unspecified, unspecified site: Secondary | ICD-10-CM | POA: Diagnosis not present

## 2015-07-16 DIAGNOSIS — B029 Zoster without complications: Secondary | ICD-10-CM | POA: Diagnosis not present

## 2015-07-16 DIAGNOSIS — Z8673 Personal history of transient ischemic attack (TIA), and cerebral infarction without residual deficits: Secondary | ICD-10-CM | POA: Diagnosis not present

## 2015-07-16 DIAGNOSIS — I252 Old myocardial infarction: Secondary | ICD-10-CM | POA: Diagnosis not present

## 2015-07-16 DIAGNOSIS — Z79899 Other long term (current) drug therapy: Secondary | ICD-10-CM | POA: Diagnosis not present

## 2015-07-30 DIAGNOSIS — M79672 Pain in left foot: Secondary | ICD-10-CM | POA: Diagnosis not present

## 2015-07-30 DIAGNOSIS — M79671 Pain in right foot: Secondary | ICD-10-CM | POA: Diagnosis not present

## 2015-07-30 DIAGNOSIS — G600 Hereditary motor and sensory neuropathy: Secondary | ICD-10-CM | POA: Diagnosis not present

## 2015-07-30 DIAGNOSIS — G5790 Unspecified mononeuropathy of unspecified lower limb: Secondary | ICD-10-CM | POA: Diagnosis not present

## 2015-08-03 DIAGNOSIS — M545 Low back pain: Secondary | ICD-10-CM | POA: Diagnosis not present

## 2015-08-03 DIAGNOSIS — R5383 Other fatigue: Secondary | ICD-10-CM | POA: Diagnosis not present

## 2015-08-03 DIAGNOSIS — K210 Gastro-esophageal reflux disease with esophagitis: Secondary | ICD-10-CM | POA: Diagnosis not present

## 2015-08-03 DIAGNOSIS — J440 Chronic obstructive pulmonary disease with acute lower respiratory infection: Secondary | ICD-10-CM | POA: Diagnosis not present

## 2015-08-09 DIAGNOSIS — M549 Dorsalgia, unspecified: Secondary | ICD-10-CM | POA: Diagnosis not present

## 2015-08-09 DIAGNOSIS — G8929 Other chronic pain: Secondary | ICD-10-CM | POA: Diagnosis not present

## 2015-08-14 DIAGNOSIS — M25551 Pain in right hip: Secondary | ICD-10-CM | POA: Diagnosis not present

## 2015-08-14 DIAGNOSIS — M25552 Pain in left hip: Secondary | ICD-10-CM | POA: Diagnosis not present

## 2015-09-03 DIAGNOSIS — G5790 Unspecified mononeuropathy of unspecified lower limb: Secondary | ICD-10-CM | POA: Diagnosis not present

## 2015-09-03 DIAGNOSIS — G600 Hereditary motor and sensory neuropathy: Secondary | ICD-10-CM | POA: Diagnosis not present

## 2015-09-03 DIAGNOSIS — M79672 Pain in left foot: Secondary | ICD-10-CM | POA: Diagnosis not present

## 2015-09-03 DIAGNOSIS — M79671 Pain in right foot: Secondary | ICD-10-CM | POA: Diagnosis not present

## 2015-09-24 DIAGNOSIS — M545 Low back pain: Secondary | ICD-10-CM | POA: Diagnosis not present

## 2015-09-24 DIAGNOSIS — R5383 Other fatigue: Secondary | ICD-10-CM | POA: Diagnosis not present

## 2015-09-24 DIAGNOSIS — K210 Gastro-esophageal reflux disease with esophagitis: Secondary | ICD-10-CM | POA: Diagnosis not present

## 2015-09-24 DIAGNOSIS — Z79899 Other long term (current) drug therapy: Secondary | ICD-10-CM | POA: Diagnosis not present

## 2015-09-24 DIAGNOSIS — J440 Chronic obstructive pulmonary disease with acute lower respiratory infection: Secondary | ICD-10-CM | POA: Diagnosis not present

## 2015-10-08 DIAGNOSIS — M79671 Pain in right foot: Secondary | ICD-10-CM | POA: Diagnosis not present

## 2015-10-08 DIAGNOSIS — G600 Hereditary motor and sensory neuropathy: Secondary | ICD-10-CM | POA: Diagnosis not present

## 2015-10-08 DIAGNOSIS — M79672 Pain in left foot: Secondary | ICD-10-CM | POA: Diagnosis not present

## 2015-10-08 DIAGNOSIS — G5790 Unspecified mononeuropathy of unspecified lower limb: Secondary | ICD-10-CM | POA: Diagnosis not present

## 2015-10-24 DIAGNOSIS — Z79899 Other long term (current) drug therapy: Secondary | ICD-10-CM | POA: Diagnosis not present

## 2015-10-24 DIAGNOSIS — M130 Polyarthritis, unspecified: Secondary | ICD-10-CM | POA: Diagnosis not present

## 2015-10-24 DIAGNOSIS — G894 Chronic pain syndrome: Secondary | ICD-10-CM | POA: Diagnosis not present

## 2015-10-24 DIAGNOSIS — M47817 Spondylosis without myelopathy or radiculopathy, lumbosacral region: Secondary | ICD-10-CM | POA: Diagnosis not present

## 2015-10-24 DIAGNOSIS — M545 Low back pain: Secondary | ICD-10-CM | POA: Diagnosis not present

## 2015-11-06 DIAGNOSIS — M79671 Pain in right foot: Secondary | ICD-10-CM | POA: Diagnosis not present

## 2015-11-06 DIAGNOSIS — G600 Hereditary motor and sensory neuropathy: Secondary | ICD-10-CM | POA: Diagnosis not present

## 2015-11-06 DIAGNOSIS — M79672 Pain in left foot: Secondary | ICD-10-CM | POA: Diagnosis not present

## 2015-11-06 DIAGNOSIS — G5790 Unspecified mononeuropathy of unspecified lower limb: Secondary | ICD-10-CM | POA: Diagnosis not present

## 2015-11-11 DIAGNOSIS — Z23 Encounter for immunization: Secondary | ICD-10-CM | POA: Diagnosis not present

## 2015-12-04 DIAGNOSIS — G5790 Unspecified mononeuropathy of unspecified lower limb: Secondary | ICD-10-CM | POA: Diagnosis not present

## 2015-12-04 DIAGNOSIS — G600 Hereditary motor and sensory neuropathy: Secondary | ICD-10-CM | POA: Diagnosis not present

## 2015-12-04 DIAGNOSIS — M79672 Pain in left foot: Secondary | ICD-10-CM | POA: Diagnosis not present

## 2015-12-04 DIAGNOSIS — M79671 Pain in right foot: Secondary | ICD-10-CM | POA: Diagnosis not present

## 2015-12-10 DIAGNOSIS — M545 Low back pain: Secondary | ICD-10-CM | POA: Diagnosis not present

## 2015-12-10 DIAGNOSIS — K210 Gastro-esophageal reflux disease with esophagitis: Secondary | ICD-10-CM | POA: Diagnosis not present

## 2015-12-10 DIAGNOSIS — I5041 Acute combined systolic (congestive) and diastolic (congestive) heart failure: Secondary | ICD-10-CM | POA: Diagnosis not present

## 2015-12-10 DIAGNOSIS — J440 Chronic obstructive pulmonary disease with acute lower respiratory infection: Secondary | ICD-10-CM | POA: Diagnosis not present

## 2015-12-12 DIAGNOSIS — M5416 Radiculopathy, lumbar region: Secondary | ICD-10-CM | POA: Diagnosis not present

## 2015-12-25 DIAGNOSIS — M5416 Radiculopathy, lumbar region: Secondary | ICD-10-CM | POA: Diagnosis not present

## 2015-12-25 DIAGNOSIS — G894 Chronic pain syndrome: Secondary | ICD-10-CM | POA: Diagnosis not present

## 2015-12-25 DIAGNOSIS — M130 Polyarthritis, unspecified: Secondary | ICD-10-CM | POA: Diagnosis not present

## 2015-12-25 DIAGNOSIS — M545 Low back pain: Secondary | ICD-10-CM | POA: Diagnosis not present

## 2015-12-25 DIAGNOSIS — M47817 Spondylosis without myelopathy or radiculopathy, lumbosacral region: Secondary | ICD-10-CM | POA: Diagnosis not present

## 2016-01-01 DIAGNOSIS — G5790 Unspecified mononeuropathy of unspecified lower limb: Secondary | ICD-10-CM | POA: Diagnosis not present

## 2016-01-01 DIAGNOSIS — M79672 Pain in left foot: Secondary | ICD-10-CM | POA: Diagnosis not present

## 2016-01-01 DIAGNOSIS — M79671 Pain in right foot: Secondary | ICD-10-CM | POA: Diagnosis not present

## 2016-01-01 DIAGNOSIS — G600 Hereditary motor and sensory neuropathy: Secondary | ICD-10-CM | POA: Diagnosis not present

## 2016-01-21 DIAGNOSIS — Z7982 Long term (current) use of aspirin: Secondary | ICD-10-CM | POA: Diagnosis not present

## 2016-01-21 DIAGNOSIS — R4182 Altered mental status, unspecified: Secondary | ICD-10-CM | POA: Diagnosis not present

## 2016-01-21 DIAGNOSIS — R29818 Other symptoms and signs involving the nervous system: Secondary | ICD-10-CM | POA: Diagnosis not present

## 2016-01-21 DIAGNOSIS — R402411 Glasgow coma scale score 13-15, in the field [EMT or ambulance]: Secondary | ICD-10-CM | POA: Diagnosis not present

## 2016-01-21 DIAGNOSIS — G309 Alzheimer's disease, unspecified: Secondary | ICD-10-CM | POA: Diagnosis not present

## 2016-01-21 DIAGNOSIS — Z8673 Personal history of transient ischemic attack (TIA), and cerebral infarction without residual deficits: Secondary | ICD-10-CM | POA: Diagnosis not present

## 2016-01-21 DIAGNOSIS — N179 Acute kidney failure, unspecified: Secondary | ICD-10-CM | POA: Diagnosis not present

## 2016-01-21 DIAGNOSIS — I252 Old myocardial infarction: Secondary | ICD-10-CM | POA: Diagnosis not present

## 2016-01-21 DIAGNOSIS — E860 Dehydration: Secondary | ICD-10-CM | POA: Diagnosis not present

## 2016-01-21 DIAGNOSIS — F0280 Dementia in other diseases classified elsewhere without behavioral disturbance: Secondary | ICD-10-CM | POA: Diagnosis not present

## 2016-01-21 DIAGNOSIS — R410 Disorientation, unspecified: Secondary | ICD-10-CM | POA: Diagnosis not present

## 2016-01-22 DIAGNOSIS — J90 Pleural effusion, not elsewhere classified: Secondary | ICD-10-CM | POA: Diagnosis not present

## 2016-01-23 DIAGNOSIS — R1319 Other dysphagia: Secondary | ICD-10-CM | POA: Diagnosis not present

## 2016-01-23 DIAGNOSIS — I509 Heart failure, unspecified: Secondary | ICD-10-CM | POA: Diagnosis not present

## 2016-01-23 DIAGNOSIS — J189 Pneumonia, unspecified organism: Secondary | ICD-10-CM | POA: Diagnosis not present

## 2016-01-23 DIAGNOSIS — C8588 Other specified types of non-Hodgkin lymphoma, lymph nodes of multiple sites: Secondary | ICD-10-CM | POA: Diagnosis not present

## 2016-01-23 DIAGNOSIS — R109 Unspecified abdominal pain: Secondary | ICD-10-CM | POA: Diagnosis not present

## 2016-01-23 DIAGNOSIS — M6281 Muscle weakness (generalized): Secondary | ICD-10-CM | POA: Diagnosis not present

## 2016-01-23 DIAGNOSIS — R279 Unspecified lack of coordination: Secondary | ICD-10-CM | POA: Diagnosis not present

## 2016-01-23 DIAGNOSIS — J9811 Atelectasis: Secondary | ICD-10-CM | POA: Diagnosis not present

## 2016-01-23 DIAGNOSIS — R262 Difficulty in walking, not elsewhere classified: Secondary | ICD-10-CM | POA: Diagnosis not present

## 2016-01-23 DIAGNOSIS — E43 Unspecified severe protein-calorie malnutrition: Secondary | ICD-10-CM | POA: Diagnosis present

## 2016-01-23 DIAGNOSIS — R1310 Dysphagia, unspecified: Secondary | ICD-10-CM | POA: Diagnosis present

## 2016-01-23 DIAGNOSIS — C8590 Non-Hodgkin lymphoma, unspecified, unspecified site: Secondary | ICD-10-CM | POA: Diagnosis present

## 2016-01-23 DIAGNOSIS — R627 Adult failure to thrive: Secondary | ICD-10-CM | POA: Diagnosis not present

## 2016-01-23 DIAGNOSIS — Z6821 Body mass index (BMI) 21.0-21.9, adult: Secondary | ICD-10-CM | POA: Diagnosis not present

## 2016-01-23 DIAGNOSIS — E860 Dehydration: Secondary | ICD-10-CM | POA: Diagnosis present

## 2016-01-23 DIAGNOSIS — Z7401 Bed confinement status: Secondary | ICD-10-CM | POA: Diagnosis not present

## 2016-01-23 DIAGNOSIS — R4781 Slurred speech: Secondary | ICD-10-CM | POA: Diagnosis not present

## 2016-01-23 DIAGNOSIS — K219 Gastro-esophageal reflux disease without esophagitis: Secondary | ICD-10-CM | POA: Diagnosis present

## 2016-01-23 DIAGNOSIS — N401 Enlarged prostate with lower urinary tract symptoms: Secondary | ICD-10-CM | POA: Diagnosis not present

## 2016-01-23 DIAGNOSIS — F0281 Dementia in other diseases classified elsewhere with behavioral disturbance: Secondary | ICD-10-CM | POA: Diagnosis not present

## 2016-01-23 DIAGNOSIS — E46 Unspecified protein-calorie malnutrition: Secondary | ICD-10-CM | POA: Diagnosis not present

## 2016-01-23 DIAGNOSIS — I2510 Atherosclerotic heart disease of native coronary artery without angina pectoris: Secondary | ICD-10-CM | POA: Diagnosis present

## 2016-01-23 DIAGNOSIS — I679 Cerebrovascular disease, unspecified: Secondary | ICD-10-CM | POA: Diagnosis present

## 2016-01-23 DIAGNOSIS — I6990 Unspecified sequelae of unspecified cerebrovascular disease: Secondary | ICD-10-CM | POA: Diagnosis not present

## 2016-01-23 DIAGNOSIS — E440 Moderate protein-calorie malnutrition: Secondary | ICD-10-CM | POA: Diagnosis not present

## 2016-01-23 DIAGNOSIS — M150 Primary generalized (osteo)arthritis: Secondary | ICD-10-CM | POA: Diagnosis not present

## 2016-01-23 DIAGNOSIS — R293 Abnormal posture: Secondary | ICD-10-CM | POA: Diagnosis not present

## 2016-01-23 DIAGNOSIS — K9423 Gastrostomy malfunction: Secondary | ICD-10-CM | POA: Diagnosis not present

## 2016-01-23 DIAGNOSIS — R410 Disorientation, unspecified: Secondary | ICD-10-CM | POA: Diagnosis present

## 2016-01-23 DIAGNOSIS — F339 Major depressive disorder, recurrent, unspecified: Secondary | ICD-10-CM | POA: Diagnosis not present

## 2016-01-23 DIAGNOSIS — N400 Enlarged prostate without lower urinary tract symptoms: Secondary | ICD-10-CM | POA: Diagnosis present

## 2016-01-23 DIAGNOSIS — R1312 Dysphagia, oropharyngeal phase: Secondary | ICD-10-CM | POA: Diagnosis not present

## 2016-01-23 DIAGNOSIS — I10 Essential (primary) hypertension: Secondary | ICD-10-CM | POA: Diagnosis not present

## 2016-01-23 DIAGNOSIS — J690 Pneumonitis due to inhalation of food and vomit: Secondary | ICD-10-CM | POA: Diagnosis present

## 2016-01-23 DIAGNOSIS — Z0389 Encounter for observation for other suspected diseases and conditions ruled out: Secondary | ICD-10-CM | POA: Diagnosis not present

## 2016-01-23 DIAGNOSIS — Z931 Gastrostomy status: Secondary | ICD-10-CM | POA: Diagnosis not present

## 2016-01-23 DIAGNOSIS — Z79899 Other long term (current) drug therapy: Secondary | ICD-10-CM | POA: Diagnosis not present

## 2016-01-29 DIAGNOSIS — I2510 Atherosclerotic heart disease of native coronary artery without angina pectoris: Secondary | ICD-10-CM | POA: Diagnosis not present

## 2016-01-29 DIAGNOSIS — R1310 Dysphagia, unspecified: Secondary | ICD-10-CM | POA: Diagnosis not present

## 2016-01-29 DIAGNOSIS — K219 Gastro-esophageal reflux disease without esophagitis: Secondary | ICD-10-CM | POA: Diagnosis not present

## 2016-02-04 DIAGNOSIS — I10 Essential (primary) hypertension: Secondary | ICD-10-CM | POA: Diagnosis not present

## 2016-02-04 DIAGNOSIS — E860 Dehydration: Secondary | ICD-10-CM | POA: Diagnosis not present

## 2016-02-04 DIAGNOSIS — R1310 Dysphagia, unspecified: Secondary | ICD-10-CM | POA: Diagnosis not present

## 2016-02-04 DIAGNOSIS — Z743 Need for continuous supervision: Secondary | ICD-10-CM | POA: Diagnosis not present

## 2016-02-04 DIAGNOSIS — Z431 Encounter for attention to gastrostomy: Secondary | ICD-10-CM | POA: Diagnosis not present

## 2016-02-04 DIAGNOSIS — Z7401 Bed confinement status: Secondary | ICD-10-CM | POA: Diagnosis not present

## 2016-02-04 DIAGNOSIS — I509 Heart failure, unspecified: Secondary | ICD-10-CM | POA: Diagnosis not present

## 2016-02-04 DIAGNOSIS — F339 Major depressive disorder, recurrent, unspecified: Secondary | ICD-10-CM | POA: Diagnosis not present

## 2016-02-04 DIAGNOSIS — C8590 Non-Hodgkin lymphoma, unspecified, unspecified site: Secondary | ICD-10-CM | POA: Diagnosis not present

## 2016-02-04 DIAGNOSIS — F0281 Dementia in other diseases classified elsewhere with behavioral disturbance: Secondary | ICD-10-CM | POA: Diagnosis not present

## 2016-02-04 DIAGNOSIS — E440 Moderate protein-calorie malnutrition: Secondary | ICD-10-CM | POA: Diagnosis not present

## 2016-02-04 DIAGNOSIS — N401 Enlarged prostate with lower urinary tract symptoms: Secondary | ICD-10-CM | POA: Diagnosis not present

## 2016-02-04 DIAGNOSIS — Z8572 Personal history of non-Hodgkin lymphomas: Secondary | ICD-10-CM | POA: Diagnosis not present

## 2016-02-04 DIAGNOSIS — K9423 Gastrostomy malfunction: Secondary | ICD-10-CM | POA: Diagnosis not present

## 2016-02-04 DIAGNOSIS — J189 Pneumonia, unspecified organism: Secondary | ICD-10-CM | POA: Diagnosis not present

## 2016-02-04 DIAGNOSIS — R1319 Other dysphagia: Secondary | ICD-10-CM | POA: Diagnosis not present

## 2016-02-04 DIAGNOSIS — R262 Difficulty in walking, not elsewhere classified: Secondary | ICD-10-CM | POA: Diagnosis not present

## 2016-02-04 DIAGNOSIS — C8588 Other specified types of non-Hodgkin lymphoma, lymph nodes of multiple sites: Secondary | ICD-10-CM | POA: Diagnosis not present

## 2016-02-04 DIAGNOSIS — R627 Adult failure to thrive: Secondary | ICD-10-CM | POA: Diagnosis not present

## 2016-02-04 DIAGNOSIS — K9420 Gastrostomy complication, unspecified: Secondary | ICD-10-CM | POA: Diagnosis not present

## 2016-02-04 DIAGNOSIS — R279 Unspecified lack of coordination: Secondary | ICD-10-CM | POA: Diagnosis not present

## 2016-02-04 DIAGNOSIS — R1110 Vomiting, unspecified: Secondary | ICD-10-CM | POA: Diagnosis not present

## 2016-02-04 DIAGNOSIS — I2510 Atherosclerotic heart disease of native coronary artery without angina pectoris: Secondary | ICD-10-CM | POA: Diagnosis not present

## 2016-02-04 DIAGNOSIS — Z4682 Encounter for fitting and adjustment of non-vascular catheter: Secondary | ICD-10-CM | POA: Diagnosis not present

## 2016-02-04 DIAGNOSIS — J690 Pneumonitis due to inhalation of food and vomit: Secondary | ICD-10-CM | POA: Diagnosis not present

## 2016-02-04 DIAGNOSIS — K9429 Other complications of gastrostomy: Secondary | ICD-10-CM | POA: Diagnosis not present

## 2016-02-04 DIAGNOSIS — R293 Abnormal posture: Secondary | ICD-10-CM | POA: Diagnosis not present

## 2016-02-04 DIAGNOSIS — K219 Gastro-esophageal reflux disease without esophagitis: Secondary | ICD-10-CM | POA: Diagnosis not present

## 2016-02-04 DIAGNOSIS — K449 Diaphragmatic hernia without obstruction or gangrene: Secondary | ICD-10-CM | POA: Diagnosis not present

## 2016-02-04 DIAGNOSIS — E46 Unspecified protein-calorie malnutrition: Secondary | ICD-10-CM | POA: Diagnosis not present

## 2016-02-04 DIAGNOSIS — M150 Primary generalized (osteo)arthritis: Secondary | ICD-10-CM | POA: Diagnosis not present

## 2016-02-04 DIAGNOSIS — I6990 Unspecified sequelae of unspecified cerebrovascular disease: Secondary | ICD-10-CM | POA: Diagnosis not present

## 2016-02-04 DIAGNOSIS — R1312 Dysphagia, oropharyngeal phase: Secondary | ICD-10-CM | POA: Diagnosis not present

## 2016-02-04 DIAGNOSIS — M6281 Muscle weakness (generalized): Secondary | ICD-10-CM | POA: Diagnosis not present

## 2016-02-04 DIAGNOSIS — Z931 Gastrostomy status: Secondary | ICD-10-CM | POA: Diagnosis not present

## 2016-02-06 DIAGNOSIS — Z8572 Personal history of non-Hodgkin lymphomas: Secondary | ICD-10-CM | POA: Diagnosis not present

## 2016-02-06 DIAGNOSIS — I2510 Atherosclerotic heart disease of native coronary artery without angina pectoris: Secondary | ICD-10-CM | POA: Diagnosis not present

## 2016-02-06 DIAGNOSIS — K219 Gastro-esophageal reflux disease without esophagitis: Secondary | ICD-10-CM | POA: Diagnosis not present

## 2016-02-06 DIAGNOSIS — R627 Adult failure to thrive: Secondary | ICD-10-CM | POA: Diagnosis not present

## 2016-02-14 DIAGNOSIS — K449 Diaphragmatic hernia without obstruction or gangrene: Secondary | ICD-10-CM | POA: Diagnosis not present

## 2016-02-14 DIAGNOSIS — R1310 Dysphagia, unspecified: Secondary | ICD-10-CM | POA: Diagnosis not present

## 2016-02-14 DIAGNOSIS — K219 Gastro-esophageal reflux disease without esophagitis: Secondary | ICD-10-CM | POA: Diagnosis not present

## 2016-02-14 DIAGNOSIS — R1110 Vomiting, unspecified: Secondary | ICD-10-CM | POA: Diagnosis not present

## 2016-03-09 DIAGNOSIS — Z4682 Encounter for fitting and adjustment of non-vascular catheter: Secondary | ICD-10-CM | POA: Diagnosis not present

## 2016-03-09 DIAGNOSIS — K9420 Gastrostomy complication, unspecified: Secondary | ICD-10-CM | POA: Diagnosis not present

## 2016-03-09 DIAGNOSIS — K9429 Other complications of gastrostomy: Secondary | ICD-10-CM | POA: Diagnosis not present

## 2016-03-13 DIAGNOSIS — I2510 Atherosclerotic heart disease of native coronary artery without angina pectoris: Secondary | ICD-10-CM | POA: Diagnosis not present

## 2016-03-13 DIAGNOSIS — R627 Adult failure to thrive: Secondary | ICD-10-CM | POA: Diagnosis not present

## 2016-03-13 DIAGNOSIS — Z8572 Personal history of non-Hodgkin lymphomas: Secondary | ICD-10-CM | POA: Diagnosis not present

## 2016-03-13 DIAGNOSIS — K219 Gastro-esophageal reflux disease without esophagitis: Secondary | ICD-10-CM | POA: Diagnosis not present

## 2016-04-03 DIAGNOSIS — I2510 Atherosclerotic heart disease of native coronary artery without angina pectoris: Secondary | ICD-10-CM | POA: Diagnosis not present

## 2016-04-03 DIAGNOSIS — I509 Heart failure, unspecified: Secondary | ICD-10-CM | POA: Diagnosis not present

## 2016-04-03 DIAGNOSIS — F339 Major depressive disorder, recurrent, unspecified: Secondary | ICD-10-CM | POA: Diagnosis not present

## 2016-04-03 DIAGNOSIS — F0281 Dementia in other diseases classified elsewhere with behavioral disturbance: Secondary | ICD-10-CM | POA: Diagnosis not present

## 2016-04-03 DIAGNOSIS — C8590 Non-Hodgkin lymphoma, unspecified, unspecified site: Secondary | ICD-10-CM | POA: Diagnosis not present

## 2016-04-03 DIAGNOSIS — I69318 Other symptoms and signs involving cognitive functions following cerebral infarction: Secondary | ICD-10-CM | POA: Diagnosis not present

## 2016-04-03 DIAGNOSIS — R1312 Dysphagia, oropharyngeal phase: Secondary | ICD-10-CM | POA: Diagnosis not present

## 2016-04-03 DIAGNOSIS — M150 Primary generalized (osteo)arthritis: Secondary | ICD-10-CM | POA: Diagnosis not present

## 2016-04-03 DIAGNOSIS — I110 Hypertensive heart disease with heart failure: Secondary | ICD-10-CM | POA: Diagnosis not present

## 2016-04-07 DIAGNOSIS — F0281 Dementia in other diseases classified elsewhere with behavioral disturbance: Secondary | ICD-10-CM | POA: Diagnosis not present

## 2016-04-07 DIAGNOSIS — I509 Heart failure, unspecified: Secondary | ICD-10-CM | POA: Diagnosis not present

## 2016-04-07 DIAGNOSIS — R1312 Dysphagia, oropharyngeal phase: Secondary | ICD-10-CM | POA: Diagnosis not present

## 2016-04-07 DIAGNOSIS — I69318 Other symptoms and signs involving cognitive functions following cerebral infarction: Secondary | ICD-10-CM | POA: Diagnosis not present

## 2016-04-07 DIAGNOSIS — I110 Hypertensive heart disease with heart failure: Secondary | ICD-10-CM | POA: Diagnosis not present

## 2016-04-07 DIAGNOSIS — I2510 Atherosclerotic heart disease of native coronary artery without angina pectoris: Secondary | ICD-10-CM | POA: Diagnosis not present

## 2016-04-08 DIAGNOSIS — J449 Chronic obstructive pulmonary disease, unspecified: Secondary | ICD-10-CM | POA: Diagnosis not present

## 2016-04-08 DIAGNOSIS — R627 Adult failure to thrive: Secondary | ICD-10-CM | POA: Diagnosis not present

## 2016-04-08 DIAGNOSIS — C8292 Follicular lymphoma, unspecified, intrathoracic lymph nodes: Secondary | ICD-10-CM | POA: Diagnosis not present

## 2016-04-08 DIAGNOSIS — E43 Unspecified severe protein-calorie malnutrition: Secondary | ICD-10-CM | POA: Diagnosis not present

## 2016-04-08 DIAGNOSIS — R1312 Dysphagia, oropharyngeal phase: Secondary | ICD-10-CM | POA: Diagnosis not present

## 2016-04-09 DIAGNOSIS — J449 Chronic obstructive pulmonary disease, unspecified: Secondary | ICD-10-CM | POA: Diagnosis not present

## 2016-04-09 DIAGNOSIS — E43 Unspecified severe protein-calorie malnutrition: Secondary | ICD-10-CM | POA: Diagnosis not present

## 2016-04-09 DIAGNOSIS — R1312 Dysphagia, oropharyngeal phase: Secondary | ICD-10-CM | POA: Diagnosis not present

## 2016-04-09 DIAGNOSIS — R627 Adult failure to thrive: Secondary | ICD-10-CM | POA: Diagnosis not present

## 2016-04-09 DIAGNOSIS — C8292 Follicular lymphoma, unspecified, intrathoracic lymph nodes: Secondary | ICD-10-CM | POA: Diagnosis not present

## 2016-04-10 DIAGNOSIS — E43 Unspecified severe protein-calorie malnutrition: Secondary | ICD-10-CM | POA: Diagnosis not present

## 2016-04-10 DIAGNOSIS — R627 Adult failure to thrive: Secondary | ICD-10-CM | POA: Diagnosis not present

## 2016-04-10 DIAGNOSIS — C8292 Follicular lymphoma, unspecified, intrathoracic lymph nodes: Secondary | ICD-10-CM | POA: Diagnosis not present

## 2016-04-10 DIAGNOSIS — R1312 Dysphagia, oropharyngeal phase: Secondary | ICD-10-CM | POA: Diagnosis not present

## 2016-04-10 DIAGNOSIS — J449 Chronic obstructive pulmonary disease, unspecified: Secondary | ICD-10-CM | POA: Diagnosis not present

## 2016-04-11 DIAGNOSIS — R1312 Dysphagia, oropharyngeal phase: Secondary | ICD-10-CM | POA: Diagnosis not present

## 2016-04-11 DIAGNOSIS — J449 Chronic obstructive pulmonary disease, unspecified: Secondary | ICD-10-CM | POA: Diagnosis not present

## 2016-04-11 DIAGNOSIS — E43 Unspecified severe protein-calorie malnutrition: Secondary | ICD-10-CM | POA: Diagnosis not present

## 2016-04-11 DIAGNOSIS — R627 Adult failure to thrive: Secondary | ICD-10-CM | POA: Diagnosis not present

## 2016-04-11 DIAGNOSIS — C8292 Follicular lymphoma, unspecified, intrathoracic lymph nodes: Secondary | ICD-10-CM | POA: Diagnosis not present

## 2016-04-14 DIAGNOSIS — C8292 Follicular lymphoma, unspecified, intrathoracic lymph nodes: Secondary | ICD-10-CM | POA: Diagnosis not present

## 2016-04-14 DIAGNOSIS — R627 Adult failure to thrive: Secondary | ICD-10-CM | POA: Diagnosis not present

## 2016-04-14 DIAGNOSIS — R1312 Dysphagia, oropharyngeal phase: Secondary | ICD-10-CM | POA: Diagnosis not present

## 2016-04-14 DIAGNOSIS — E43 Unspecified severe protein-calorie malnutrition: Secondary | ICD-10-CM | POA: Diagnosis not present

## 2016-04-14 DIAGNOSIS — J449 Chronic obstructive pulmonary disease, unspecified: Secondary | ICD-10-CM | POA: Diagnosis not present

## 2016-04-15 DIAGNOSIS — R1312 Dysphagia, oropharyngeal phase: Secondary | ICD-10-CM | POA: Diagnosis not present

## 2016-04-15 DIAGNOSIS — C8292 Follicular lymphoma, unspecified, intrathoracic lymph nodes: Secondary | ICD-10-CM | POA: Diagnosis not present

## 2016-04-15 DIAGNOSIS — R627 Adult failure to thrive: Secondary | ICD-10-CM | POA: Diagnosis not present

## 2016-04-15 DIAGNOSIS — E43 Unspecified severe protein-calorie malnutrition: Secondary | ICD-10-CM | POA: Diagnosis not present

## 2016-04-15 DIAGNOSIS — J449 Chronic obstructive pulmonary disease, unspecified: Secondary | ICD-10-CM | POA: Diagnosis not present

## 2016-04-16 DIAGNOSIS — J449 Chronic obstructive pulmonary disease, unspecified: Secondary | ICD-10-CM | POA: Diagnosis not present

## 2016-04-16 DIAGNOSIS — R627 Adult failure to thrive: Secondary | ICD-10-CM | POA: Diagnosis not present

## 2016-04-16 DIAGNOSIS — R1312 Dysphagia, oropharyngeal phase: Secondary | ICD-10-CM | POA: Diagnosis not present

## 2016-04-16 DIAGNOSIS — C8292 Follicular lymphoma, unspecified, intrathoracic lymph nodes: Secondary | ICD-10-CM | POA: Diagnosis not present

## 2016-04-16 DIAGNOSIS — E43 Unspecified severe protein-calorie malnutrition: Secondary | ICD-10-CM | POA: Diagnosis not present

## 2016-04-17 DIAGNOSIS — R1312 Dysphagia, oropharyngeal phase: Secondary | ICD-10-CM | POA: Diagnosis not present

## 2016-04-17 DIAGNOSIS — E43 Unspecified severe protein-calorie malnutrition: Secondary | ICD-10-CM | POA: Diagnosis not present

## 2016-04-17 DIAGNOSIS — R627 Adult failure to thrive: Secondary | ICD-10-CM | POA: Diagnosis not present

## 2016-04-17 DIAGNOSIS — C8292 Follicular lymphoma, unspecified, intrathoracic lymph nodes: Secondary | ICD-10-CM | POA: Diagnosis not present

## 2016-04-17 DIAGNOSIS — J449 Chronic obstructive pulmonary disease, unspecified: Secondary | ICD-10-CM | POA: Diagnosis not present

## 2016-04-18 DIAGNOSIS — E43 Unspecified severe protein-calorie malnutrition: Secondary | ICD-10-CM | POA: Diagnosis not present

## 2016-04-18 DIAGNOSIS — R627 Adult failure to thrive: Secondary | ICD-10-CM | POA: Diagnosis not present

## 2016-04-18 DIAGNOSIS — J449 Chronic obstructive pulmonary disease, unspecified: Secondary | ICD-10-CM | POA: Diagnosis not present

## 2016-04-18 DIAGNOSIS — C8292 Follicular lymphoma, unspecified, intrathoracic lymph nodes: Secondary | ICD-10-CM | POA: Diagnosis not present

## 2016-04-18 DIAGNOSIS — R1312 Dysphagia, oropharyngeal phase: Secondary | ICD-10-CM | POA: Diagnosis not present

## 2016-04-21 DIAGNOSIS — E43 Unspecified severe protein-calorie malnutrition: Secondary | ICD-10-CM | POA: Diagnosis not present

## 2016-04-21 DIAGNOSIS — C8292 Follicular lymphoma, unspecified, intrathoracic lymph nodes: Secondary | ICD-10-CM | POA: Diagnosis not present

## 2016-04-21 DIAGNOSIS — J449 Chronic obstructive pulmonary disease, unspecified: Secondary | ICD-10-CM | POA: Diagnosis not present

## 2016-04-21 DIAGNOSIS — R627 Adult failure to thrive: Secondary | ICD-10-CM | POA: Diagnosis not present

## 2016-04-21 DIAGNOSIS — R1312 Dysphagia, oropharyngeal phase: Secondary | ICD-10-CM | POA: Diagnosis not present

## 2016-04-23 DIAGNOSIS — R627 Adult failure to thrive: Secondary | ICD-10-CM | POA: Diagnosis not present

## 2016-04-23 DIAGNOSIS — C8292 Follicular lymphoma, unspecified, intrathoracic lymph nodes: Secondary | ICD-10-CM | POA: Diagnosis not present

## 2016-04-23 DIAGNOSIS — J449 Chronic obstructive pulmonary disease, unspecified: Secondary | ICD-10-CM | POA: Diagnosis not present

## 2016-04-23 DIAGNOSIS — E43 Unspecified severe protein-calorie malnutrition: Secondary | ICD-10-CM | POA: Diagnosis not present

## 2016-04-23 DIAGNOSIS — R1312 Dysphagia, oropharyngeal phase: Secondary | ICD-10-CM | POA: Diagnosis not present

## 2016-04-24 DIAGNOSIS — C8292 Follicular lymphoma, unspecified, intrathoracic lymph nodes: Secondary | ICD-10-CM | POA: Diagnosis not present

## 2016-04-24 DIAGNOSIS — R627 Adult failure to thrive: Secondary | ICD-10-CM | POA: Diagnosis not present

## 2016-04-24 DIAGNOSIS — J449 Chronic obstructive pulmonary disease, unspecified: Secondary | ICD-10-CM | POA: Diagnosis not present

## 2016-04-24 DIAGNOSIS — E43 Unspecified severe protein-calorie malnutrition: Secondary | ICD-10-CM | POA: Diagnosis not present

## 2016-04-24 DIAGNOSIS — R1312 Dysphagia, oropharyngeal phase: Secondary | ICD-10-CM | POA: Diagnosis not present

## 2016-04-25 DIAGNOSIS — R627 Adult failure to thrive: Secondary | ICD-10-CM | POA: Diagnosis not present

## 2016-04-25 DIAGNOSIS — C8292 Follicular lymphoma, unspecified, intrathoracic lymph nodes: Secondary | ICD-10-CM | POA: Diagnosis not present

## 2016-04-25 DIAGNOSIS — R1312 Dysphagia, oropharyngeal phase: Secondary | ICD-10-CM | POA: Diagnosis not present

## 2016-04-25 DIAGNOSIS — J449 Chronic obstructive pulmonary disease, unspecified: Secondary | ICD-10-CM | POA: Diagnosis not present

## 2016-04-25 DIAGNOSIS — E43 Unspecified severe protein-calorie malnutrition: Secondary | ICD-10-CM | POA: Diagnosis not present

## 2016-04-28 DIAGNOSIS — C8292 Follicular lymphoma, unspecified, intrathoracic lymph nodes: Secondary | ICD-10-CM | POA: Diagnosis not present

## 2016-04-28 DIAGNOSIS — E43 Unspecified severe protein-calorie malnutrition: Secondary | ICD-10-CM | POA: Diagnosis not present

## 2016-04-28 DIAGNOSIS — R1312 Dysphagia, oropharyngeal phase: Secondary | ICD-10-CM | POA: Diagnosis not present

## 2016-04-28 DIAGNOSIS — J449 Chronic obstructive pulmonary disease, unspecified: Secondary | ICD-10-CM | POA: Diagnosis not present

## 2016-04-28 DIAGNOSIS — R627 Adult failure to thrive: Secondary | ICD-10-CM | POA: Diagnosis not present

## 2016-04-30 DIAGNOSIS — R627 Adult failure to thrive: Secondary | ICD-10-CM | POA: Diagnosis not present

## 2016-04-30 DIAGNOSIS — C8292 Follicular lymphoma, unspecified, intrathoracic lymph nodes: Secondary | ICD-10-CM | POA: Diagnosis not present

## 2016-04-30 DIAGNOSIS — E43 Unspecified severe protein-calorie malnutrition: Secondary | ICD-10-CM | POA: Diagnosis not present

## 2016-04-30 DIAGNOSIS — R1312 Dysphagia, oropharyngeal phase: Secondary | ICD-10-CM | POA: Diagnosis not present

## 2016-04-30 DIAGNOSIS — J449 Chronic obstructive pulmonary disease, unspecified: Secondary | ICD-10-CM | POA: Diagnosis not present

## 2016-05-01 DIAGNOSIS — E43 Unspecified severe protein-calorie malnutrition: Secondary | ICD-10-CM | POA: Diagnosis not present

## 2016-05-01 DIAGNOSIS — R627 Adult failure to thrive: Secondary | ICD-10-CM | POA: Diagnosis not present

## 2016-05-01 DIAGNOSIS — C8292 Follicular lymphoma, unspecified, intrathoracic lymph nodes: Secondary | ICD-10-CM | POA: Diagnosis not present

## 2016-05-01 DIAGNOSIS — J449 Chronic obstructive pulmonary disease, unspecified: Secondary | ICD-10-CM | POA: Diagnosis not present

## 2016-05-01 DIAGNOSIS — R1312 Dysphagia, oropharyngeal phase: Secondary | ICD-10-CM | POA: Diagnosis not present

## 2016-05-02 DIAGNOSIS — R1312 Dysphagia, oropharyngeal phase: Secondary | ICD-10-CM | POA: Diagnosis not present

## 2016-05-02 DIAGNOSIS — E43 Unspecified severe protein-calorie malnutrition: Secondary | ICD-10-CM | POA: Diagnosis not present

## 2016-05-02 DIAGNOSIS — R627 Adult failure to thrive: Secondary | ICD-10-CM | POA: Diagnosis not present

## 2016-05-02 DIAGNOSIS — C8292 Follicular lymphoma, unspecified, intrathoracic lymph nodes: Secondary | ICD-10-CM | POA: Diagnosis not present

## 2016-05-02 DIAGNOSIS — J449 Chronic obstructive pulmonary disease, unspecified: Secondary | ICD-10-CM | POA: Diagnosis not present

## 2016-05-05 DIAGNOSIS — E43 Unspecified severe protein-calorie malnutrition: Secondary | ICD-10-CM | POA: Diagnosis not present

## 2016-05-05 DIAGNOSIS — C8292 Follicular lymphoma, unspecified, intrathoracic lymph nodes: Secondary | ICD-10-CM | POA: Diagnosis not present

## 2016-05-05 DIAGNOSIS — J449 Chronic obstructive pulmonary disease, unspecified: Secondary | ICD-10-CM | POA: Diagnosis not present

## 2016-05-05 DIAGNOSIS — R1312 Dysphagia, oropharyngeal phase: Secondary | ICD-10-CM | POA: Diagnosis not present

## 2016-05-05 DIAGNOSIS — R627 Adult failure to thrive: Secondary | ICD-10-CM | POA: Diagnosis not present

## 2016-05-07 DIAGNOSIS — C8292 Follicular lymphoma, unspecified, intrathoracic lymph nodes: Secondary | ICD-10-CM | POA: Diagnosis not present

## 2016-05-07 DIAGNOSIS — J449 Chronic obstructive pulmonary disease, unspecified: Secondary | ICD-10-CM | POA: Diagnosis not present

## 2016-05-07 DIAGNOSIS — R627 Adult failure to thrive: Secondary | ICD-10-CM | POA: Diagnosis not present

## 2016-05-07 DIAGNOSIS — E43 Unspecified severe protein-calorie malnutrition: Secondary | ICD-10-CM | POA: Diagnosis not present

## 2016-05-07 DIAGNOSIS — R1312 Dysphagia, oropharyngeal phase: Secondary | ICD-10-CM | POA: Diagnosis not present

## 2016-05-08 DIAGNOSIS — J449 Chronic obstructive pulmonary disease, unspecified: Secondary | ICD-10-CM | POA: Diagnosis not present

## 2016-05-08 DIAGNOSIS — C8292 Follicular lymphoma, unspecified, intrathoracic lymph nodes: Secondary | ICD-10-CM | POA: Diagnosis not present

## 2016-05-08 DIAGNOSIS — R627 Adult failure to thrive: Secondary | ICD-10-CM | POA: Diagnosis not present

## 2016-05-08 DIAGNOSIS — R1312 Dysphagia, oropharyngeal phase: Secondary | ICD-10-CM | POA: Diagnosis not present

## 2016-05-08 DIAGNOSIS — E43 Unspecified severe protein-calorie malnutrition: Secondary | ICD-10-CM | POA: Diagnosis not present

## 2016-05-09 DIAGNOSIS — C8292 Follicular lymphoma, unspecified, intrathoracic lymph nodes: Secondary | ICD-10-CM | POA: Diagnosis not present

## 2016-05-09 DIAGNOSIS — E43 Unspecified severe protein-calorie malnutrition: Secondary | ICD-10-CM | POA: Diagnosis not present

## 2016-05-09 DIAGNOSIS — R1312 Dysphagia, oropharyngeal phase: Secondary | ICD-10-CM | POA: Diagnosis not present

## 2016-05-09 DIAGNOSIS — R627 Adult failure to thrive: Secondary | ICD-10-CM | POA: Diagnosis not present

## 2016-05-09 DIAGNOSIS — J449 Chronic obstructive pulmonary disease, unspecified: Secondary | ICD-10-CM | POA: Diagnosis not present

## 2016-05-12 DIAGNOSIS — J449 Chronic obstructive pulmonary disease, unspecified: Secondary | ICD-10-CM | POA: Diagnosis not present

## 2016-05-12 DIAGNOSIS — E43 Unspecified severe protein-calorie malnutrition: Secondary | ICD-10-CM | POA: Diagnosis not present

## 2016-05-12 DIAGNOSIS — R627 Adult failure to thrive: Secondary | ICD-10-CM | POA: Diagnosis not present

## 2016-05-12 DIAGNOSIS — R1312 Dysphagia, oropharyngeal phase: Secondary | ICD-10-CM | POA: Diagnosis not present

## 2016-05-12 DIAGNOSIS — C8292 Follicular lymphoma, unspecified, intrathoracic lymph nodes: Secondary | ICD-10-CM | POA: Diagnosis not present

## 2016-05-14 DIAGNOSIS — E43 Unspecified severe protein-calorie malnutrition: Secondary | ICD-10-CM | POA: Diagnosis not present

## 2016-05-14 DIAGNOSIS — R1312 Dysphagia, oropharyngeal phase: Secondary | ICD-10-CM | POA: Diagnosis not present

## 2016-05-14 DIAGNOSIS — C8292 Follicular lymphoma, unspecified, intrathoracic lymph nodes: Secondary | ICD-10-CM | POA: Diagnosis not present

## 2016-05-14 DIAGNOSIS — J449 Chronic obstructive pulmonary disease, unspecified: Secondary | ICD-10-CM | POA: Diagnosis not present

## 2016-05-14 DIAGNOSIS — R627 Adult failure to thrive: Secondary | ICD-10-CM | POA: Diagnosis not present

## 2016-05-15 DIAGNOSIS — E43 Unspecified severe protein-calorie malnutrition: Secondary | ICD-10-CM | POA: Diagnosis not present

## 2016-05-15 DIAGNOSIS — J449 Chronic obstructive pulmonary disease, unspecified: Secondary | ICD-10-CM | POA: Diagnosis not present

## 2016-05-15 DIAGNOSIS — R1312 Dysphagia, oropharyngeal phase: Secondary | ICD-10-CM | POA: Diagnosis not present

## 2016-05-15 DIAGNOSIS — R627 Adult failure to thrive: Secondary | ICD-10-CM | POA: Diagnosis not present

## 2016-05-15 DIAGNOSIS — C8292 Follicular lymphoma, unspecified, intrathoracic lymph nodes: Secondary | ICD-10-CM | POA: Diagnosis not present

## 2016-05-16 DIAGNOSIS — J449 Chronic obstructive pulmonary disease, unspecified: Secondary | ICD-10-CM | POA: Diagnosis not present

## 2016-05-16 DIAGNOSIS — C8292 Follicular lymphoma, unspecified, intrathoracic lymph nodes: Secondary | ICD-10-CM | POA: Diagnosis not present

## 2016-05-16 DIAGNOSIS — E43 Unspecified severe protein-calorie malnutrition: Secondary | ICD-10-CM | POA: Diagnosis not present

## 2016-05-16 DIAGNOSIS — R1312 Dysphagia, oropharyngeal phase: Secondary | ICD-10-CM | POA: Diagnosis not present

## 2016-05-16 DIAGNOSIS — R627 Adult failure to thrive: Secondary | ICD-10-CM | POA: Diagnosis not present

## 2016-05-19 DIAGNOSIS — E43 Unspecified severe protein-calorie malnutrition: Secondary | ICD-10-CM | POA: Diagnosis not present

## 2016-05-19 DIAGNOSIS — C8292 Follicular lymphoma, unspecified, intrathoracic lymph nodes: Secondary | ICD-10-CM | POA: Diagnosis not present

## 2016-05-19 DIAGNOSIS — R627 Adult failure to thrive: Secondary | ICD-10-CM | POA: Diagnosis not present

## 2016-05-19 DIAGNOSIS — J449 Chronic obstructive pulmonary disease, unspecified: Secondary | ICD-10-CM | POA: Diagnosis not present

## 2016-05-19 DIAGNOSIS — R1312 Dysphagia, oropharyngeal phase: Secondary | ICD-10-CM | POA: Diagnosis not present

## 2016-05-21 DIAGNOSIS — R1312 Dysphagia, oropharyngeal phase: Secondary | ICD-10-CM | POA: Diagnosis not present

## 2016-05-21 DIAGNOSIS — C8292 Follicular lymphoma, unspecified, intrathoracic lymph nodes: Secondary | ICD-10-CM | POA: Diagnosis not present

## 2016-05-21 DIAGNOSIS — R627 Adult failure to thrive: Secondary | ICD-10-CM | POA: Diagnosis not present

## 2016-05-21 DIAGNOSIS — J449 Chronic obstructive pulmonary disease, unspecified: Secondary | ICD-10-CM | POA: Diagnosis not present

## 2016-05-21 DIAGNOSIS — E43 Unspecified severe protein-calorie malnutrition: Secondary | ICD-10-CM | POA: Diagnosis not present

## 2016-05-22 DIAGNOSIS — R1312 Dysphagia, oropharyngeal phase: Secondary | ICD-10-CM | POA: Diagnosis not present

## 2016-05-22 DIAGNOSIS — C8292 Follicular lymphoma, unspecified, intrathoracic lymph nodes: Secondary | ICD-10-CM | POA: Diagnosis not present

## 2016-05-22 DIAGNOSIS — R627 Adult failure to thrive: Secondary | ICD-10-CM | POA: Diagnosis not present

## 2016-05-22 DIAGNOSIS — E43 Unspecified severe protein-calorie malnutrition: Secondary | ICD-10-CM | POA: Diagnosis not present

## 2016-05-22 DIAGNOSIS — J449 Chronic obstructive pulmonary disease, unspecified: Secondary | ICD-10-CM | POA: Diagnosis not present

## 2016-05-25 DIAGNOSIS — E43 Unspecified severe protein-calorie malnutrition: Secondary | ICD-10-CM | POA: Diagnosis not present

## 2016-05-25 DIAGNOSIS — J449 Chronic obstructive pulmonary disease, unspecified: Secondary | ICD-10-CM | POA: Diagnosis not present

## 2016-05-25 DIAGNOSIS — C8292 Follicular lymphoma, unspecified, intrathoracic lymph nodes: Secondary | ICD-10-CM | POA: Diagnosis not present

## 2016-05-25 DIAGNOSIS — R1312 Dysphagia, oropharyngeal phase: Secondary | ICD-10-CM | POA: Diagnosis not present

## 2016-05-25 DIAGNOSIS — R627 Adult failure to thrive: Secondary | ICD-10-CM | POA: Diagnosis not present

## 2016-05-26 DIAGNOSIS — E43 Unspecified severe protein-calorie malnutrition: Secondary | ICD-10-CM | POA: Diagnosis not present

## 2016-05-26 DIAGNOSIS — R627 Adult failure to thrive: Secondary | ICD-10-CM | POA: Diagnosis not present

## 2016-05-26 DIAGNOSIS — J449 Chronic obstructive pulmonary disease, unspecified: Secondary | ICD-10-CM | POA: Diagnosis not present

## 2016-05-26 DIAGNOSIS — R1312 Dysphagia, oropharyngeal phase: Secondary | ICD-10-CM | POA: Diagnosis not present

## 2016-05-26 DIAGNOSIS — C8292 Follicular lymphoma, unspecified, intrathoracic lymph nodes: Secondary | ICD-10-CM | POA: Diagnosis not present

## 2016-05-28 DIAGNOSIS — R1312 Dysphagia, oropharyngeal phase: Secondary | ICD-10-CM | POA: Diagnosis not present

## 2016-05-28 DIAGNOSIS — R627 Adult failure to thrive: Secondary | ICD-10-CM | POA: Diagnosis not present

## 2016-05-28 DIAGNOSIS — C8292 Follicular lymphoma, unspecified, intrathoracic lymph nodes: Secondary | ICD-10-CM | POA: Diagnosis not present

## 2016-05-28 DIAGNOSIS — J449 Chronic obstructive pulmonary disease, unspecified: Secondary | ICD-10-CM | POA: Diagnosis not present

## 2016-05-28 DIAGNOSIS — E43 Unspecified severe protein-calorie malnutrition: Secondary | ICD-10-CM | POA: Diagnosis not present

## 2016-05-30 DIAGNOSIS — J449 Chronic obstructive pulmonary disease, unspecified: Secondary | ICD-10-CM | POA: Diagnosis not present

## 2016-05-30 DIAGNOSIS — E43 Unspecified severe protein-calorie malnutrition: Secondary | ICD-10-CM | POA: Diagnosis not present

## 2016-05-30 DIAGNOSIS — R627 Adult failure to thrive: Secondary | ICD-10-CM | POA: Diagnosis not present

## 2016-05-30 DIAGNOSIS — R1312 Dysphagia, oropharyngeal phase: Secondary | ICD-10-CM | POA: Diagnosis not present

## 2016-05-30 DIAGNOSIS — C8292 Follicular lymphoma, unspecified, intrathoracic lymph nodes: Secondary | ICD-10-CM | POA: Diagnosis not present

## 2016-06-02 DIAGNOSIS — C8292 Follicular lymphoma, unspecified, intrathoracic lymph nodes: Secondary | ICD-10-CM | POA: Diagnosis not present

## 2016-06-02 DIAGNOSIS — E43 Unspecified severe protein-calorie malnutrition: Secondary | ICD-10-CM | POA: Diagnosis not present

## 2016-06-02 DIAGNOSIS — R1312 Dysphagia, oropharyngeal phase: Secondary | ICD-10-CM | POA: Diagnosis not present

## 2016-06-02 DIAGNOSIS — R627 Adult failure to thrive: Secondary | ICD-10-CM | POA: Diagnosis not present

## 2016-06-02 DIAGNOSIS — J449 Chronic obstructive pulmonary disease, unspecified: Secondary | ICD-10-CM | POA: Diagnosis not present

## 2016-06-04 DIAGNOSIS — E43 Unspecified severe protein-calorie malnutrition: Secondary | ICD-10-CM | POA: Diagnosis not present

## 2016-06-04 DIAGNOSIS — J449 Chronic obstructive pulmonary disease, unspecified: Secondary | ICD-10-CM | POA: Diagnosis not present

## 2016-06-04 DIAGNOSIS — R627 Adult failure to thrive: Secondary | ICD-10-CM | POA: Diagnosis not present

## 2016-06-04 DIAGNOSIS — C8292 Follicular lymphoma, unspecified, intrathoracic lymph nodes: Secondary | ICD-10-CM | POA: Diagnosis not present

## 2016-06-04 DIAGNOSIS — R1312 Dysphagia, oropharyngeal phase: Secondary | ICD-10-CM | POA: Diagnosis not present

## 2016-06-06 DIAGNOSIS — R627 Adult failure to thrive: Secondary | ICD-10-CM | POA: Diagnosis not present

## 2016-06-06 DIAGNOSIS — C8292 Follicular lymphoma, unspecified, intrathoracic lymph nodes: Secondary | ICD-10-CM | POA: Diagnosis not present

## 2016-06-06 DIAGNOSIS — J449 Chronic obstructive pulmonary disease, unspecified: Secondary | ICD-10-CM | POA: Diagnosis not present

## 2016-06-06 DIAGNOSIS — R1312 Dysphagia, oropharyngeal phase: Secondary | ICD-10-CM | POA: Diagnosis not present

## 2016-06-06 DIAGNOSIS — E43 Unspecified severe protein-calorie malnutrition: Secondary | ICD-10-CM | POA: Diagnosis not present

## 2016-06-09 DIAGNOSIS — C8292 Follicular lymphoma, unspecified, intrathoracic lymph nodes: Secondary | ICD-10-CM | POA: Diagnosis not present

## 2016-06-09 DIAGNOSIS — R627 Adult failure to thrive: Secondary | ICD-10-CM | POA: Diagnosis not present

## 2016-06-09 DIAGNOSIS — R1312 Dysphagia, oropharyngeal phase: Secondary | ICD-10-CM | POA: Diagnosis not present

## 2016-06-09 DIAGNOSIS — J449 Chronic obstructive pulmonary disease, unspecified: Secondary | ICD-10-CM | POA: Diagnosis not present

## 2016-06-09 DIAGNOSIS — E43 Unspecified severe protein-calorie malnutrition: Secondary | ICD-10-CM | POA: Diagnosis not present

## 2016-06-11 DIAGNOSIS — R1312 Dysphagia, oropharyngeal phase: Secondary | ICD-10-CM | POA: Diagnosis not present

## 2016-06-11 DIAGNOSIS — J449 Chronic obstructive pulmonary disease, unspecified: Secondary | ICD-10-CM | POA: Diagnosis not present

## 2016-06-11 DIAGNOSIS — C8292 Follicular lymphoma, unspecified, intrathoracic lymph nodes: Secondary | ICD-10-CM | POA: Diagnosis not present

## 2016-06-11 DIAGNOSIS — R627 Adult failure to thrive: Secondary | ICD-10-CM | POA: Diagnosis not present

## 2016-06-11 DIAGNOSIS — E43 Unspecified severe protein-calorie malnutrition: Secondary | ICD-10-CM | POA: Diagnosis not present

## 2016-06-12 DIAGNOSIS — E43 Unspecified severe protein-calorie malnutrition: Secondary | ICD-10-CM | POA: Diagnosis not present

## 2016-06-12 DIAGNOSIS — J449 Chronic obstructive pulmonary disease, unspecified: Secondary | ICD-10-CM | POA: Diagnosis not present

## 2016-06-12 DIAGNOSIS — C8292 Follicular lymphoma, unspecified, intrathoracic lymph nodes: Secondary | ICD-10-CM | POA: Diagnosis not present

## 2016-06-12 DIAGNOSIS — R1312 Dysphagia, oropharyngeal phase: Secondary | ICD-10-CM | POA: Diagnosis not present

## 2016-06-12 DIAGNOSIS — R627 Adult failure to thrive: Secondary | ICD-10-CM | POA: Diagnosis not present

## 2016-06-13 DIAGNOSIS — C8292 Follicular lymphoma, unspecified, intrathoracic lymph nodes: Secondary | ICD-10-CM | POA: Diagnosis not present

## 2016-06-13 DIAGNOSIS — R1312 Dysphagia, oropharyngeal phase: Secondary | ICD-10-CM | POA: Diagnosis not present

## 2016-06-13 DIAGNOSIS — R627 Adult failure to thrive: Secondary | ICD-10-CM | POA: Diagnosis not present

## 2016-06-13 DIAGNOSIS — E43 Unspecified severe protein-calorie malnutrition: Secondary | ICD-10-CM | POA: Diagnosis not present

## 2016-06-13 DIAGNOSIS — J449 Chronic obstructive pulmonary disease, unspecified: Secondary | ICD-10-CM | POA: Diagnosis not present

## 2016-06-13 DIAGNOSIS — R8299 Other abnormal findings in urine: Secondary | ICD-10-CM | POA: Diagnosis not present

## 2016-06-16 DIAGNOSIS — C8292 Follicular lymphoma, unspecified, intrathoracic lymph nodes: Secondary | ICD-10-CM | POA: Diagnosis not present

## 2016-06-16 DIAGNOSIS — R1312 Dysphagia, oropharyngeal phase: Secondary | ICD-10-CM | POA: Diagnosis not present

## 2016-06-16 DIAGNOSIS — E43 Unspecified severe protein-calorie malnutrition: Secondary | ICD-10-CM | POA: Diagnosis not present

## 2016-06-16 DIAGNOSIS — J449 Chronic obstructive pulmonary disease, unspecified: Secondary | ICD-10-CM | POA: Diagnosis not present

## 2016-06-16 DIAGNOSIS — R627 Adult failure to thrive: Secondary | ICD-10-CM | POA: Diagnosis not present

## 2016-06-17 DIAGNOSIS — C8292 Follicular lymphoma, unspecified, intrathoracic lymph nodes: Secondary | ICD-10-CM | POA: Diagnosis not present

## 2016-06-17 DIAGNOSIS — E43 Unspecified severe protein-calorie malnutrition: Secondary | ICD-10-CM | POA: Diagnosis not present

## 2016-06-17 DIAGNOSIS — R627 Adult failure to thrive: Secondary | ICD-10-CM | POA: Diagnosis not present

## 2016-06-17 DIAGNOSIS — R1312 Dysphagia, oropharyngeal phase: Secondary | ICD-10-CM | POA: Diagnosis not present

## 2016-06-17 DIAGNOSIS — J449 Chronic obstructive pulmonary disease, unspecified: Secondary | ICD-10-CM | POA: Diagnosis not present

## 2016-06-18 DIAGNOSIS — R627 Adult failure to thrive: Secondary | ICD-10-CM | POA: Diagnosis not present

## 2016-06-18 DIAGNOSIS — R1312 Dysphagia, oropharyngeal phase: Secondary | ICD-10-CM | POA: Diagnosis not present

## 2016-06-18 DIAGNOSIS — J449 Chronic obstructive pulmonary disease, unspecified: Secondary | ICD-10-CM | POA: Diagnosis not present

## 2016-06-18 DIAGNOSIS — C8292 Follicular lymphoma, unspecified, intrathoracic lymph nodes: Secondary | ICD-10-CM | POA: Diagnosis not present

## 2016-06-18 DIAGNOSIS — E43 Unspecified severe protein-calorie malnutrition: Secondary | ICD-10-CM | POA: Diagnosis not present

## 2016-06-19 DIAGNOSIS — R1312 Dysphagia, oropharyngeal phase: Secondary | ICD-10-CM | POA: Diagnosis not present

## 2016-06-19 DIAGNOSIS — E43 Unspecified severe protein-calorie malnutrition: Secondary | ICD-10-CM | POA: Diagnosis not present

## 2016-06-19 DIAGNOSIS — J449 Chronic obstructive pulmonary disease, unspecified: Secondary | ICD-10-CM | POA: Diagnosis not present

## 2016-06-19 DIAGNOSIS — C8292 Follicular lymphoma, unspecified, intrathoracic lymph nodes: Secondary | ICD-10-CM | POA: Diagnosis not present

## 2016-06-19 DIAGNOSIS — R627 Adult failure to thrive: Secondary | ICD-10-CM | POA: Diagnosis not present

## 2016-06-23 DIAGNOSIS — C8292 Follicular lymphoma, unspecified, intrathoracic lymph nodes: Secondary | ICD-10-CM | POA: Diagnosis not present

## 2016-06-23 DIAGNOSIS — E43 Unspecified severe protein-calorie malnutrition: Secondary | ICD-10-CM | POA: Diagnosis not present

## 2016-06-23 DIAGNOSIS — J449 Chronic obstructive pulmonary disease, unspecified: Secondary | ICD-10-CM | POA: Diagnosis not present

## 2016-06-23 DIAGNOSIS — R627 Adult failure to thrive: Secondary | ICD-10-CM | POA: Diagnosis not present

## 2016-06-23 DIAGNOSIS — R1312 Dysphagia, oropharyngeal phase: Secondary | ICD-10-CM | POA: Diagnosis not present

## 2016-06-24 DIAGNOSIS — R627 Adult failure to thrive: Secondary | ICD-10-CM | POA: Diagnosis not present

## 2016-06-24 DIAGNOSIS — J449 Chronic obstructive pulmonary disease, unspecified: Secondary | ICD-10-CM | POA: Diagnosis not present

## 2016-06-24 DIAGNOSIS — R1312 Dysphagia, oropharyngeal phase: Secondary | ICD-10-CM | POA: Diagnosis not present

## 2016-06-24 DIAGNOSIS — C8292 Follicular lymphoma, unspecified, intrathoracic lymph nodes: Secondary | ICD-10-CM | POA: Diagnosis not present

## 2016-06-24 DIAGNOSIS — E43 Unspecified severe protein-calorie malnutrition: Secondary | ICD-10-CM | POA: Diagnosis not present

## 2016-06-26 DIAGNOSIS — E43 Unspecified severe protein-calorie malnutrition: Secondary | ICD-10-CM | POA: Diagnosis not present

## 2016-06-26 DIAGNOSIS — J449 Chronic obstructive pulmonary disease, unspecified: Secondary | ICD-10-CM | POA: Diagnosis not present

## 2016-06-26 DIAGNOSIS — R627 Adult failure to thrive: Secondary | ICD-10-CM | POA: Diagnosis not present

## 2016-06-26 DIAGNOSIS — R1312 Dysphagia, oropharyngeal phase: Secondary | ICD-10-CM | POA: Diagnosis not present

## 2016-06-26 DIAGNOSIS — C8292 Follicular lymphoma, unspecified, intrathoracic lymph nodes: Secondary | ICD-10-CM | POA: Diagnosis not present

## 2016-06-30 DIAGNOSIS — R1312 Dysphagia, oropharyngeal phase: Secondary | ICD-10-CM | POA: Diagnosis not present

## 2016-06-30 DIAGNOSIS — C8292 Follicular lymphoma, unspecified, intrathoracic lymph nodes: Secondary | ICD-10-CM | POA: Diagnosis not present

## 2016-06-30 DIAGNOSIS — R627 Adult failure to thrive: Secondary | ICD-10-CM | POA: Diagnosis not present

## 2016-06-30 DIAGNOSIS — E43 Unspecified severe protein-calorie malnutrition: Secondary | ICD-10-CM | POA: Diagnosis not present

## 2016-06-30 DIAGNOSIS — J449 Chronic obstructive pulmonary disease, unspecified: Secondary | ICD-10-CM | POA: Diagnosis not present

## 2016-07-02 DIAGNOSIS — C8292 Follicular lymphoma, unspecified, intrathoracic lymph nodes: Secondary | ICD-10-CM | POA: Diagnosis not present

## 2016-07-02 DIAGNOSIS — R1312 Dysphagia, oropharyngeal phase: Secondary | ICD-10-CM | POA: Diagnosis not present

## 2016-07-02 DIAGNOSIS — J449 Chronic obstructive pulmonary disease, unspecified: Secondary | ICD-10-CM | POA: Diagnosis not present

## 2016-07-02 DIAGNOSIS — R627 Adult failure to thrive: Secondary | ICD-10-CM | POA: Diagnosis not present

## 2016-07-02 DIAGNOSIS — E43 Unspecified severe protein-calorie malnutrition: Secondary | ICD-10-CM | POA: Diagnosis not present

## 2016-07-04 DIAGNOSIS — C8292 Follicular lymphoma, unspecified, intrathoracic lymph nodes: Secondary | ICD-10-CM | POA: Diagnosis not present

## 2016-07-04 DIAGNOSIS — E43 Unspecified severe protein-calorie malnutrition: Secondary | ICD-10-CM | POA: Diagnosis not present

## 2016-07-04 DIAGNOSIS — R1312 Dysphagia, oropharyngeal phase: Secondary | ICD-10-CM | POA: Diagnosis not present

## 2016-07-04 DIAGNOSIS — R627 Adult failure to thrive: Secondary | ICD-10-CM | POA: Diagnosis not present

## 2016-07-04 DIAGNOSIS — J449 Chronic obstructive pulmonary disease, unspecified: Secondary | ICD-10-CM | POA: Diagnosis not present

## 2016-07-07 DIAGNOSIS — R1312 Dysphagia, oropharyngeal phase: Secondary | ICD-10-CM | POA: Diagnosis not present

## 2016-07-07 DIAGNOSIS — R627 Adult failure to thrive: Secondary | ICD-10-CM | POA: Diagnosis not present

## 2016-07-07 DIAGNOSIS — J449 Chronic obstructive pulmonary disease, unspecified: Secondary | ICD-10-CM | POA: Diagnosis not present

## 2016-07-07 DIAGNOSIS — C8292 Follicular lymphoma, unspecified, intrathoracic lymph nodes: Secondary | ICD-10-CM | POA: Diagnosis not present

## 2016-07-07 DIAGNOSIS — E43 Unspecified severe protein-calorie malnutrition: Secondary | ICD-10-CM | POA: Diagnosis not present

## 2016-07-09 DIAGNOSIS — R1312 Dysphagia, oropharyngeal phase: Secondary | ICD-10-CM | POA: Diagnosis not present

## 2016-07-09 DIAGNOSIS — J449 Chronic obstructive pulmonary disease, unspecified: Secondary | ICD-10-CM | POA: Diagnosis not present

## 2016-07-09 DIAGNOSIS — R627 Adult failure to thrive: Secondary | ICD-10-CM | POA: Diagnosis not present

## 2016-07-09 DIAGNOSIS — E43 Unspecified severe protein-calorie malnutrition: Secondary | ICD-10-CM | POA: Diagnosis not present

## 2016-07-09 DIAGNOSIS — C8292 Follicular lymphoma, unspecified, intrathoracic lymph nodes: Secondary | ICD-10-CM | POA: Diagnosis not present

## 2016-07-11 DIAGNOSIS — C8292 Follicular lymphoma, unspecified, intrathoracic lymph nodes: Secondary | ICD-10-CM | POA: Diagnosis not present

## 2016-07-11 DIAGNOSIS — E43 Unspecified severe protein-calorie malnutrition: Secondary | ICD-10-CM | POA: Diagnosis not present

## 2016-07-11 DIAGNOSIS — R1312 Dysphagia, oropharyngeal phase: Secondary | ICD-10-CM | POA: Diagnosis not present

## 2016-07-11 DIAGNOSIS — R627 Adult failure to thrive: Secondary | ICD-10-CM | POA: Diagnosis not present

## 2016-07-11 DIAGNOSIS — J449 Chronic obstructive pulmonary disease, unspecified: Secondary | ICD-10-CM | POA: Diagnosis not present

## 2016-07-14 DIAGNOSIS — C8292 Follicular lymphoma, unspecified, intrathoracic lymph nodes: Secondary | ICD-10-CM | POA: Diagnosis not present

## 2016-07-14 DIAGNOSIS — R1312 Dysphagia, oropharyngeal phase: Secondary | ICD-10-CM | POA: Diagnosis not present

## 2016-07-14 DIAGNOSIS — E43 Unspecified severe protein-calorie malnutrition: Secondary | ICD-10-CM | POA: Diagnosis not present

## 2016-07-14 DIAGNOSIS — R627 Adult failure to thrive: Secondary | ICD-10-CM | POA: Diagnosis not present

## 2016-07-14 DIAGNOSIS — J449 Chronic obstructive pulmonary disease, unspecified: Secondary | ICD-10-CM | POA: Diagnosis not present

## 2016-07-15 DIAGNOSIS — R627 Adult failure to thrive: Secondary | ICD-10-CM | POA: Diagnosis not present

## 2016-07-15 DIAGNOSIS — C8292 Follicular lymphoma, unspecified, intrathoracic lymph nodes: Secondary | ICD-10-CM | POA: Diagnosis not present

## 2016-07-15 DIAGNOSIS — R1312 Dysphagia, oropharyngeal phase: Secondary | ICD-10-CM | POA: Diagnosis not present

## 2016-07-15 DIAGNOSIS — E43 Unspecified severe protein-calorie malnutrition: Secondary | ICD-10-CM | POA: Diagnosis not present

## 2016-07-15 DIAGNOSIS — J449 Chronic obstructive pulmonary disease, unspecified: Secondary | ICD-10-CM | POA: Diagnosis not present

## 2016-07-16 DIAGNOSIS — R627 Adult failure to thrive: Secondary | ICD-10-CM | POA: Diagnosis not present

## 2016-07-16 DIAGNOSIS — E43 Unspecified severe protein-calorie malnutrition: Secondary | ICD-10-CM | POA: Diagnosis not present

## 2016-07-16 DIAGNOSIS — J449 Chronic obstructive pulmonary disease, unspecified: Secondary | ICD-10-CM | POA: Diagnosis not present

## 2016-07-16 DIAGNOSIS — R1312 Dysphagia, oropharyngeal phase: Secondary | ICD-10-CM | POA: Diagnosis not present

## 2016-07-16 DIAGNOSIS — C8292 Follicular lymphoma, unspecified, intrathoracic lymph nodes: Secondary | ICD-10-CM | POA: Diagnosis not present

## 2016-07-17 DIAGNOSIS — E43 Unspecified severe protein-calorie malnutrition: Secondary | ICD-10-CM | POA: Diagnosis not present

## 2016-07-17 DIAGNOSIS — C8292 Follicular lymphoma, unspecified, intrathoracic lymph nodes: Secondary | ICD-10-CM | POA: Diagnosis not present

## 2016-07-17 DIAGNOSIS — R627 Adult failure to thrive: Secondary | ICD-10-CM | POA: Diagnosis not present

## 2016-07-17 DIAGNOSIS — R1312 Dysphagia, oropharyngeal phase: Secondary | ICD-10-CM | POA: Diagnosis not present

## 2016-07-17 DIAGNOSIS — J449 Chronic obstructive pulmonary disease, unspecified: Secondary | ICD-10-CM | POA: Diagnosis not present

## 2016-07-18 DIAGNOSIS — R627 Adult failure to thrive: Secondary | ICD-10-CM | POA: Diagnosis not present

## 2016-07-18 DIAGNOSIS — C8292 Follicular lymphoma, unspecified, intrathoracic lymph nodes: Secondary | ICD-10-CM | POA: Diagnosis not present

## 2016-07-18 DIAGNOSIS — R1312 Dysphagia, oropharyngeal phase: Secondary | ICD-10-CM | POA: Diagnosis not present

## 2016-07-18 DIAGNOSIS — J449 Chronic obstructive pulmonary disease, unspecified: Secondary | ICD-10-CM | POA: Diagnosis not present

## 2016-07-18 DIAGNOSIS — E43 Unspecified severe protein-calorie malnutrition: Secondary | ICD-10-CM | POA: Diagnosis not present

## 2016-07-22 DIAGNOSIS — C8292 Follicular lymphoma, unspecified, intrathoracic lymph nodes: Secondary | ICD-10-CM | POA: Diagnosis not present

## 2016-07-22 DIAGNOSIS — R1312 Dysphagia, oropharyngeal phase: Secondary | ICD-10-CM | POA: Diagnosis not present

## 2016-07-22 DIAGNOSIS — E43 Unspecified severe protein-calorie malnutrition: Secondary | ICD-10-CM | POA: Diagnosis not present

## 2016-07-22 DIAGNOSIS — J449 Chronic obstructive pulmonary disease, unspecified: Secondary | ICD-10-CM | POA: Diagnosis not present

## 2016-07-22 DIAGNOSIS — R627 Adult failure to thrive: Secondary | ICD-10-CM | POA: Diagnosis not present

## 2016-07-23 DIAGNOSIS — R1312 Dysphagia, oropharyngeal phase: Secondary | ICD-10-CM | POA: Diagnosis not present

## 2016-07-23 DIAGNOSIS — J449 Chronic obstructive pulmonary disease, unspecified: Secondary | ICD-10-CM | POA: Diagnosis not present

## 2016-07-23 DIAGNOSIS — C8292 Follicular lymphoma, unspecified, intrathoracic lymph nodes: Secondary | ICD-10-CM | POA: Diagnosis not present

## 2016-07-23 DIAGNOSIS — R627 Adult failure to thrive: Secondary | ICD-10-CM | POA: Diagnosis not present

## 2016-07-23 DIAGNOSIS — E43 Unspecified severe protein-calorie malnutrition: Secondary | ICD-10-CM | POA: Diagnosis not present

## 2016-07-25 DIAGNOSIS — C8292 Follicular lymphoma, unspecified, intrathoracic lymph nodes: Secondary | ICD-10-CM | POA: Diagnosis not present

## 2016-07-25 DIAGNOSIS — E43 Unspecified severe protein-calorie malnutrition: Secondary | ICD-10-CM | POA: Diagnosis not present

## 2016-07-25 DIAGNOSIS — R627 Adult failure to thrive: Secondary | ICD-10-CM | POA: Diagnosis not present

## 2016-07-25 DIAGNOSIS — J449 Chronic obstructive pulmonary disease, unspecified: Secondary | ICD-10-CM | POA: Diagnosis not present

## 2016-07-25 DIAGNOSIS — R1312 Dysphagia, oropharyngeal phase: Secondary | ICD-10-CM | POA: Diagnosis not present

## 2016-07-28 DIAGNOSIS — R1312 Dysphagia, oropharyngeal phase: Secondary | ICD-10-CM | POA: Diagnosis not present

## 2016-07-28 DIAGNOSIS — E43 Unspecified severe protein-calorie malnutrition: Secondary | ICD-10-CM | POA: Diagnosis not present

## 2016-07-28 DIAGNOSIS — R627 Adult failure to thrive: Secondary | ICD-10-CM | POA: Diagnosis not present

## 2016-07-28 DIAGNOSIS — C8292 Follicular lymphoma, unspecified, intrathoracic lymph nodes: Secondary | ICD-10-CM | POA: Diagnosis not present

## 2016-07-28 DIAGNOSIS — J449 Chronic obstructive pulmonary disease, unspecified: Secondary | ICD-10-CM | POA: Diagnosis not present

## 2016-07-30 DIAGNOSIS — C8292 Follicular lymphoma, unspecified, intrathoracic lymph nodes: Secondary | ICD-10-CM | POA: Diagnosis not present

## 2016-07-30 DIAGNOSIS — E43 Unspecified severe protein-calorie malnutrition: Secondary | ICD-10-CM | POA: Diagnosis not present

## 2016-07-30 DIAGNOSIS — J449 Chronic obstructive pulmonary disease, unspecified: Secondary | ICD-10-CM | POA: Diagnosis not present

## 2016-07-30 DIAGNOSIS — R1312 Dysphagia, oropharyngeal phase: Secondary | ICD-10-CM | POA: Diagnosis not present

## 2016-07-30 DIAGNOSIS — R627 Adult failure to thrive: Secondary | ICD-10-CM | POA: Diagnosis not present

## 2016-08-01 DIAGNOSIS — C8292 Follicular lymphoma, unspecified, intrathoracic lymph nodes: Secondary | ICD-10-CM | POA: Diagnosis not present

## 2016-08-01 DIAGNOSIS — J449 Chronic obstructive pulmonary disease, unspecified: Secondary | ICD-10-CM | POA: Diagnosis not present

## 2016-08-01 DIAGNOSIS — R1312 Dysphagia, oropharyngeal phase: Secondary | ICD-10-CM | POA: Diagnosis not present

## 2016-08-01 DIAGNOSIS — E43 Unspecified severe protein-calorie malnutrition: Secondary | ICD-10-CM | POA: Diagnosis not present

## 2016-08-01 DIAGNOSIS — R627 Adult failure to thrive: Secondary | ICD-10-CM | POA: Diagnosis not present

## 2016-08-04 DIAGNOSIS — R1312 Dysphagia, oropharyngeal phase: Secondary | ICD-10-CM | POA: Diagnosis not present

## 2016-08-04 DIAGNOSIS — C8292 Follicular lymphoma, unspecified, intrathoracic lymph nodes: Secondary | ICD-10-CM | POA: Diagnosis not present

## 2016-08-04 DIAGNOSIS — R627 Adult failure to thrive: Secondary | ICD-10-CM | POA: Diagnosis not present

## 2016-08-04 DIAGNOSIS — J449 Chronic obstructive pulmonary disease, unspecified: Secondary | ICD-10-CM | POA: Diagnosis not present

## 2016-08-04 DIAGNOSIS — E43 Unspecified severe protein-calorie malnutrition: Secondary | ICD-10-CM | POA: Diagnosis not present

## 2016-08-06 DIAGNOSIS — J449 Chronic obstructive pulmonary disease, unspecified: Secondary | ICD-10-CM | POA: Diagnosis not present

## 2016-08-06 DIAGNOSIS — R627 Adult failure to thrive: Secondary | ICD-10-CM | POA: Diagnosis not present

## 2016-08-06 DIAGNOSIS — C8292 Follicular lymphoma, unspecified, intrathoracic lymph nodes: Secondary | ICD-10-CM | POA: Diagnosis not present

## 2016-08-06 DIAGNOSIS — R1312 Dysphagia, oropharyngeal phase: Secondary | ICD-10-CM | POA: Diagnosis not present

## 2016-08-06 DIAGNOSIS — E43 Unspecified severe protein-calorie malnutrition: Secondary | ICD-10-CM | POA: Diagnosis not present

## 2016-08-07 DIAGNOSIS — R627 Adult failure to thrive: Secondary | ICD-10-CM | POA: Diagnosis not present

## 2016-08-07 DIAGNOSIS — E43 Unspecified severe protein-calorie malnutrition: Secondary | ICD-10-CM | POA: Diagnosis not present

## 2016-08-07 DIAGNOSIS — R1312 Dysphagia, oropharyngeal phase: Secondary | ICD-10-CM | POA: Diagnosis not present

## 2016-08-07 DIAGNOSIS — C8292 Follicular lymphoma, unspecified, intrathoracic lymph nodes: Secondary | ICD-10-CM | POA: Diagnosis not present

## 2016-08-07 DIAGNOSIS — J449 Chronic obstructive pulmonary disease, unspecified: Secondary | ICD-10-CM | POA: Diagnosis not present

## 2016-08-08 DIAGNOSIS — R627 Adult failure to thrive: Secondary | ICD-10-CM | POA: Diagnosis not present

## 2016-08-08 DIAGNOSIS — J449 Chronic obstructive pulmonary disease, unspecified: Secondary | ICD-10-CM | POA: Diagnosis not present

## 2016-08-08 DIAGNOSIS — C8292 Follicular lymphoma, unspecified, intrathoracic lymph nodes: Secondary | ICD-10-CM | POA: Diagnosis not present

## 2016-08-08 DIAGNOSIS — E43 Unspecified severe protein-calorie malnutrition: Secondary | ICD-10-CM | POA: Diagnosis not present

## 2016-08-08 DIAGNOSIS — R1312 Dysphagia, oropharyngeal phase: Secondary | ICD-10-CM | POA: Diagnosis not present

## 2016-08-11 DIAGNOSIS — R1312 Dysphagia, oropharyngeal phase: Secondary | ICD-10-CM | POA: Diagnosis not present

## 2016-08-11 DIAGNOSIS — J449 Chronic obstructive pulmonary disease, unspecified: Secondary | ICD-10-CM | POA: Diagnosis not present

## 2016-08-11 DIAGNOSIS — C8292 Follicular lymphoma, unspecified, intrathoracic lymph nodes: Secondary | ICD-10-CM | POA: Diagnosis not present

## 2016-08-11 DIAGNOSIS — E43 Unspecified severe protein-calorie malnutrition: Secondary | ICD-10-CM | POA: Diagnosis not present

## 2016-08-11 DIAGNOSIS — R627 Adult failure to thrive: Secondary | ICD-10-CM | POA: Diagnosis not present

## 2016-08-13 DIAGNOSIS — R627 Adult failure to thrive: Secondary | ICD-10-CM | POA: Diagnosis not present

## 2016-08-13 DIAGNOSIS — J449 Chronic obstructive pulmonary disease, unspecified: Secondary | ICD-10-CM | POA: Diagnosis not present

## 2016-08-13 DIAGNOSIS — C8292 Follicular lymphoma, unspecified, intrathoracic lymph nodes: Secondary | ICD-10-CM | POA: Diagnosis not present

## 2016-08-13 DIAGNOSIS — E43 Unspecified severe protein-calorie malnutrition: Secondary | ICD-10-CM | POA: Diagnosis not present

## 2016-08-13 DIAGNOSIS — R1312 Dysphagia, oropharyngeal phase: Secondary | ICD-10-CM | POA: Diagnosis not present

## 2016-08-14 DIAGNOSIS — C8292 Follicular lymphoma, unspecified, intrathoracic lymph nodes: Secondary | ICD-10-CM | POA: Diagnosis not present

## 2016-08-14 DIAGNOSIS — R627 Adult failure to thrive: Secondary | ICD-10-CM | POA: Diagnosis not present

## 2016-08-14 DIAGNOSIS — E43 Unspecified severe protein-calorie malnutrition: Secondary | ICD-10-CM | POA: Diagnosis not present

## 2016-08-14 DIAGNOSIS — R1312 Dysphagia, oropharyngeal phase: Secondary | ICD-10-CM | POA: Diagnosis not present

## 2016-08-14 DIAGNOSIS — J449 Chronic obstructive pulmonary disease, unspecified: Secondary | ICD-10-CM | POA: Diagnosis not present

## 2016-08-15 DIAGNOSIS — E43 Unspecified severe protein-calorie malnutrition: Secondary | ICD-10-CM | POA: Diagnosis not present

## 2016-08-15 DIAGNOSIS — R1312 Dysphagia, oropharyngeal phase: Secondary | ICD-10-CM | POA: Diagnosis not present

## 2016-08-15 DIAGNOSIS — R627 Adult failure to thrive: Secondary | ICD-10-CM | POA: Diagnosis not present

## 2016-08-15 DIAGNOSIS — J449 Chronic obstructive pulmonary disease, unspecified: Secondary | ICD-10-CM | POA: Diagnosis not present

## 2016-08-15 DIAGNOSIS — C8292 Follicular lymphoma, unspecified, intrathoracic lymph nodes: Secondary | ICD-10-CM | POA: Diagnosis not present

## 2016-08-18 DIAGNOSIS — R1312 Dysphagia, oropharyngeal phase: Secondary | ICD-10-CM | POA: Diagnosis not present

## 2016-08-18 DIAGNOSIS — E43 Unspecified severe protein-calorie malnutrition: Secondary | ICD-10-CM | POA: Diagnosis not present

## 2016-08-18 DIAGNOSIS — R627 Adult failure to thrive: Secondary | ICD-10-CM | POA: Diagnosis not present

## 2016-08-18 DIAGNOSIS — J449 Chronic obstructive pulmonary disease, unspecified: Secondary | ICD-10-CM | POA: Diagnosis not present

## 2016-08-18 DIAGNOSIS — C8292 Follicular lymphoma, unspecified, intrathoracic lymph nodes: Secondary | ICD-10-CM | POA: Diagnosis not present

## 2016-08-20 DIAGNOSIS — J449 Chronic obstructive pulmonary disease, unspecified: Secondary | ICD-10-CM | POA: Diagnosis not present

## 2016-08-20 DIAGNOSIS — R1312 Dysphagia, oropharyngeal phase: Secondary | ICD-10-CM | POA: Diagnosis not present

## 2016-08-20 DIAGNOSIS — R627 Adult failure to thrive: Secondary | ICD-10-CM | POA: Diagnosis not present

## 2016-08-20 DIAGNOSIS — E43 Unspecified severe protein-calorie malnutrition: Secondary | ICD-10-CM | POA: Diagnosis not present

## 2016-08-20 DIAGNOSIS — C8292 Follicular lymphoma, unspecified, intrathoracic lymph nodes: Secondary | ICD-10-CM | POA: Diagnosis not present

## 2016-08-22 DIAGNOSIS — J449 Chronic obstructive pulmonary disease, unspecified: Secondary | ICD-10-CM | POA: Diagnosis not present

## 2016-08-22 DIAGNOSIS — E43 Unspecified severe protein-calorie malnutrition: Secondary | ICD-10-CM | POA: Diagnosis not present

## 2016-08-22 DIAGNOSIS — R1312 Dysphagia, oropharyngeal phase: Secondary | ICD-10-CM | POA: Diagnosis not present

## 2016-08-22 DIAGNOSIS — C8292 Follicular lymphoma, unspecified, intrathoracic lymph nodes: Secondary | ICD-10-CM | POA: Diagnosis not present

## 2016-08-22 DIAGNOSIS — R627 Adult failure to thrive: Secondary | ICD-10-CM | POA: Diagnosis not present

## 2016-08-24 DIAGNOSIS — R627 Adult failure to thrive: Secondary | ICD-10-CM | POA: Diagnosis not present

## 2016-08-24 DIAGNOSIS — C8292 Follicular lymphoma, unspecified, intrathoracic lymph nodes: Secondary | ICD-10-CM | POA: Diagnosis not present

## 2016-08-24 DIAGNOSIS — R1312 Dysphagia, oropharyngeal phase: Secondary | ICD-10-CM | POA: Diagnosis not present

## 2016-08-24 DIAGNOSIS — E43 Unspecified severe protein-calorie malnutrition: Secondary | ICD-10-CM | POA: Diagnosis not present

## 2016-08-24 DIAGNOSIS — J449 Chronic obstructive pulmonary disease, unspecified: Secondary | ICD-10-CM | POA: Diagnosis not present

## 2016-08-25 DIAGNOSIS — C8292 Follicular lymphoma, unspecified, intrathoracic lymph nodes: Secondary | ICD-10-CM | POA: Diagnosis not present

## 2016-08-25 DIAGNOSIS — R1312 Dysphagia, oropharyngeal phase: Secondary | ICD-10-CM | POA: Diagnosis not present

## 2016-08-25 DIAGNOSIS — E43 Unspecified severe protein-calorie malnutrition: Secondary | ICD-10-CM | POA: Diagnosis not present

## 2016-08-25 DIAGNOSIS — J449 Chronic obstructive pulmonary disease, unspecified: Secondary | ICD-10-CM | POA: Diagnosis not present

## 2016-08-25 DIAGNOSIS — R627 Adult failure to thrive: Secondary | ICD-10-CM | POA: Diagnosis not present

## 2016-08-28 DIAGNOSIS — C8292 Follicular lymphoma, unspecified, intrathoracic lymph nodes: Secondary | ICD-10-CM | POA: Diagnosis not present

## 2016-08-28 DIAGNOSIS — E43 Unspecified severe protein-calorie malnutrition: Secondary | ICD-10-CM | POA: Diagnosis not present

## 2016-08-28 DIAGNOSIS — J449 Chronic obstructive pulmonary disease, unspecified: Secondary | ICD-10-CM | POA: Diagnosis not present

## 2016-08-28 DIAGNOSIS — R627 Adult failure to thrive: Secondary | ICD-10-CM | POA: Diagnosis not present

## 2016-08-28 DIAGNOSIS — R1312 Dysphagia, oropharyngeal phase: Secondary | ICD-10-CM | POA: Diagnosis not present

## 2016-08-29 DIAGNOSIS — J449 Chronic obstructive pulmonary disease, unspecified: Secondary | ICD-10-CM | POA: Diagnosis not present

## 2016-08-29 DIAGNOSIS — R1312 Dysphagia, oropharyngeal phase: Secondary | ICD-10-CM | POA: Diagnosis not present

## 2016-08-29 DIAGNOSIS — C8292 Follicular lymphoma, unspecified, intrathoracic lymph nodes: Secondary | ICD-10-CM | POA: Diagnosis not present

## 2016-08-29 DIAGNOSIS — E43 Unspecified severe protein-calorie malnutrition: Secondary | ICD-10-CM | POA: Diagnosis not present

## 2016-08-29 DIAGNOSIS — R627 Adult failure to thrive: Secondary | ICD-10-CM | POA: Diagnosis not present

## 2016-09-01 DIAGNOSIS — R1312 Dysphagia, oropharyngeal phase: Secondary | ICD-10-CM | POA: Diagnosis not present

## 2016-09-01 DIAGNOSIS — R627 Adult failure to thrive: Secondary | ICD-10-CM | POA: Diagnosis not present

## 2016-09-01 DIAGNOSIS — C8292 Follicular lymphoma, unspecified, intrathoracic lymph nodes: Secondary | ICD-10-CM | POA: Diagnosis not present

## 2016-09-01 DIAGNOSIS — E43 Unspecified severe protein-calorie malnutrition: Secondary | ICD-10-CM | POA: Diagnosis not present

## 2016-09-01 DIAGNOSIS — J449 Chronic obstructive pulmonary disease, unspecified: Secondary | ICD-10-CM | POA: Diagnosis not present

## 2016-09-03 DIAGNOSIS — C8292 Follicular lymphoma, unspecified, intrathoracic lymph nodes: Secondary | ICD-10-CM | POA: Diagnosis not present

## 2016-09-03 DIAGNOSIS — R1312 Dysphagia, oropharyngeal phase: Secondary | ICD-10-CM | POA: Diagnosis not present

## 2016-09-03 DIAGNOSIS — J449 Chronic obstructive pulmonary disease, unspecified: Secondary | ICD-10-CM | POA: Diagnosis not present

## 2016-09-03 DIAGNOSIS — R627 Adult failure to thrive: Secondary | ICD-10-CM | POA: Diagnosis not present

## 2016-09-03 DIAGNOSIS — E43 Unspecified severe protein-calorie malnutrition: Secondary | ICD-10-CM | POA: Diagnosis not present

## 2016-09-05 DIAGNOSIS — J449 Chronic obstructive pulmonary disease, unspecified: Secondary | ICD-10-CM | POA: Diagnosis not present

## 2016-09-05 DIAGNOSIS — R1312 Dysphagia, oropharyngeal phase: Secondary | ICD-10-CM | POA: Diagnosis not present

## 2016-09-05 DIAGNOSIS — C8292 Follicular lymphoma, unspecified, intrathoracic lymph nodes: Secondary | ICD-10-CM | POA: Diagnosis not present

## 2016-09-05 DIAGNOSIS — E43 Unspecified severe protein-calorie malnutrition: Secondary | ICD-10-CM | POA: Diagnosis not present

## 2016-09-05 DIAGNOSIS — R627 Adult failure to thrive: Secondary | ICD-10-CM | POA: Diagnosis not present

## 2016-09-08 DIAGNOSIS — C8292 Follicular lymphoma, unspecified, intrathoracic lymph nodes: Secondary | ICD-10-CM | POA: Diagnosis not present

## 2016-09-08 DIAGNOSIS — J449 Chronic obstructive pulmonary disease, unspecified: Secondary | ICD-10-CM | POA: Diagnosis not present

## 2016-09-08 DIAGNOSIS — R1312 Dysphagia, oropharyngeal phase: Secondary | ICD-10-CM | POA: Diagnosis not present

## 2016-09-08 DIAGNOSIS — E43 Unspecified severe protein-calorie malnutrition: Secondary | ICD-10-CM | POA: Diagnosis not present

## 2016-09-08 DIAGNOSIS — R627 Adult failure to thrive: Secondary | ICD-10-CM | POA: Diagnosis not present

## 2016-09-09 DIAGNOSIS — J449 Chronic obstructive pulmonary disease, unspecified: Secondary | ICD-10-CM | POA: Diagnosis not present

## 2016-09-09 DIAGNOSIS — R1312 Dysphagia, oropharyngeal phase: Secondary | ICD-10-CM | POA: Diagnosis not present

## 2016-09-09 DIAGNOSIS — E43 Unspecified severe protein-calorie malnutrition: Secondary | ICD-10-CM | POA: Diagnosis not present

## 2016-09-09 DIAGNOSIS — R627 Adult failure to thrive: Secondary | ICD-10-CM | POA: Diagnosis not present

## 2016-09-09 DIAGNOSIS — C8292 Follicular lymphoma, unspecified, intrathoracic lymph nodes: Secondary | ICD-10-CM | POA: Diagnosis not present

## 2016-09-10 DIAGNOSIS — E43 Unspecified severe protein-calorie malnutrition: Secondary | ICD-10-CM | POA: Diagnosis not present

## 2016-09-10 DIAGNOSIS — R627 Adult failure to thrive: Secondary | ICD-10-CM | POA: Diagnosis not present

## 2016-09-10 DIAGNOSIS — C8292 Follicular lymphoma, unspecified, intrathoracic lymph nodes: Secondary | ICD-10-CM | POA: Diagnosis not present

## 2016-09-10 DIAGNOSIS — R1312 Dysphagia, oropharyngeal phase: Secondary | ICD-10-CM | POA: Diagnosis not present

## 2016-09-10 DIAGNOSIS — J449 Chronic obstructive pulmonary disease, unspecified: Secondary | ICD-10-CM | POA: Diagnosis not present

## 2016-09-12 DIAGNOSIS — R627 Adult failure to thrive: Secondary | ICD-10-CM | POA: Diagnosis not present

## 2016-09-12 DIAGNOSIS — E43 Unspecified severe protein-calorie malnutrition: Secondary | ICD-10-CM | POA: Diagnosis not present

## 2016-09-12 DIAGNOSIS — R1312 Dysphagia, oropharyngeal phase: Secondary | ICD-10-CM | POA: Diagnosis not present

## 2016-09-12 DIAGNOSIS — C8292 Follicular lymphoma, unspecified, intrathoracic lymph nodes: Secondary | ICD-10-CM | POA: Diagnosis not present

## 2016-09-12 DIAGNOSIS — J449 Chronic obstructive pulmonary disease, unspecified: Secondary | ICD-10-CM | POA: Diagnosis not present

## 2016-09-15 DIAGNOSIS — J449 Chronic obstructive pulmonary disease, unspecified: Secondary | ICD-10-CM | POA: Diagnosis not present

## 2016-09-15 DIAGNOSIS — E43 Unspecified severe protein-calorie malnutrition: Secondary | ICD-10-CM | POA: Diagnosis not present

## 2016-09-15 DIAGNOSIS — C8292 Follicular lymphoma, unspecified, intrathoracic lymph nodes: Secondary | ICD-10-CM | POA: Diagnosis not present

## 2016-09-15 DIAGNOSIS — R627 Adult failure to thrive: Secondary | ICD-10-CM | POA: Diagnosis not present

## 2016-09-15 DIAGNOSIS — R1312 Dysphagia, oropharyngeal phase: Secondary | ICD-10-CM | POA: Diagnosis not present

## 2016-09-16 DIAGNOSIS — J449 Chronic obstructive pulmonary disease, unspecified: Secondary | ICD-10-CM | POA: Diagnosis not present

## 2016-09-16 DIAGNOSIS — C8292 Follicular lymphoma, unspecified, intrathoracic lymph nodes: Secondary | ICD-10-CM | POA: Diagnosis not present

## 2016-09-16 DIAGNOSIS — E43 Unspecified severe protein-calorie malnutrition: Secondary | ICD-10-CM | POA: Diagnosis not present

## 2016-09-16 DIAGNOSIS — R627 Adult failure to thrive: Secondary | ICD-10-CM | POA: Diagnosis not present

## 2016-09-16 DIAGNOSIS — R1312 Dysphagia, oropharyngeal phase: Secondary | ICD-10-CM | POA: Diagnosis not present

## 2016-09-17 DIAGNOSIS — R627 Adult failure to thrive: Secondary | ICD-10-CM | POA: Diagnosis not present

## 2016-09-17 DIAGNOSIS — R1312 Dysphagia, oropharyngeal phase: Secondary | ICD-10-CM | POA: Diagnosis not present

## 2016-09-17 DIAGNOSIS — J449 Chronic obstructive pulmonary disease, unspecified: Secondary | ICD-10-CM | POA: Diagnosis not present

## 2016-09-17 DIAGNOSIS — C8292 Follicular lymphoma, unspecified, intrathoracic lymph nodes: Secondary | ICD-10-CM | POA: Diagnosis not present

## 2016-09-17 DIAGNOSIS — E43 Unspecified severe protein-calorie malnutrition: Secondary | ICD-10-CM | POA: Diagnosis not present

## 2016-09-19 DIAGNOSIS — R1312 Dysphagia, oropharyngeal phase: Secondary | ICD-10-CM | POA: Diagnosis not present

## 2016-09-19 DIAGNOSIS — J449 Chronic obstructive pulmonary disease, unspecified: Secondary | ICD-10-CM | POA: Diagnosis not present

## 2016-09-19 DIAGNOSIS — E43 Unspecified severe protein-calorie malnutrition: Secondary | ICD-10-CM | POA: Diagnosis not present

## 2016-09-19 DIAGNOSIS — R627 Adult failure to thrive: Secondary | ICD-10-CM | POA: Diagnosis not present

## 2016-09-19 DIAGNOSIS — C8292 Follicular lymphoma, unspecified, intrathoracic lymph nodes: Secondary | ICD-10-CM | POA: Diagnosis not present

## 2016-09-22 DIAGNOSIS — R627 Adult failure to thrive: Secondary | ICD-10-CM | POA: Diagnosis not present

## 2016-09-22 DIAGNOSIS — C8292 Follicular lymphoma, unspecified, intrathoracic lymph nodes: Secondary | ICD-10-CM | POA: Diagnosis not present

## 2016-09-22 DIAGNOSIS — J449 Chronic obstructive pulmonary disease, unspecified: Secondary | ICD-10-CM | POA: Diagnosis not present

## 2016-09-22 DIAGNOSIS — E43 Unspecified severe protein-calorie malnutrition: Secondary | ICD-10-CM | POA: Diagnosis not present

## 2016-09-22 DIAGNOSIS — R1312 Dysphagia, oropharyngeal phase: Secondary | ICD-10-CM | POA: Diagnosis not present

## 2016-09-23 DIAGNOSIS — R627 Adult failure to thrive: Secondary | ICD-10-CM | POA: Diagnosis not present

## 2016-09-23 DIAGNOSIS — C8292 Follicular lymphoma, unspecified, intrathoracic lymph nodes: Secondary | ICD-10-CM | POA: Diagnosis not present

## 2016-09-23 DIAGNOSIS — J449 Chronic obstructive pulmonary disease, unspecified: Secondary | ICD-10-CM | POA: Diagnosis not present

## 2016-09-23 DIAGNOSIS — R1312 Dysphagia, oropharyngeal phase: Secondary | ICD-10-CM | POA: Diagnosis not present

## 2016-09-23 DIAGNOSIS — E43 Unspecified severe protein-calorie malnutrition: Secondary | ICD-10-CM | POA: Diagnosis not present

## 2016-09-24 DIAGNOSIS — E43 Unspecified severe protein-calorie malnutrition: Secondary | ICD-10-CM | POA: Diagnosis not present

## 2016-09-24 DIAGNOSIS — C8292 Follicular lymphoma, unspecified, intrathoracic lymph nodes: Secondary | ICD-10-CM | POA: Diagnosis not present

## 2016-09-24 DIAGNOSIS — J449 Chronic obstructive pulmonary disease, unspecified: Secondary | ICD-10-CM | POA: Diagnosis not present

## 2016-09-24 DIAGNOSIS — R627 Adult failure to thrive: Secondary | ICD-10-CM | POA: Diagnosis not present

## 2016-09-24 DIAGNOSIS — R1312 Dysphagia, oropharyngeal phase: Secondary | ICD-10-CM | POA: Diagnosis not present

## 2016-09-26 DIAGNOSIS — E43 Unspecified severe protein-calorie malnutrition: Secondary | ICD-10-CM | POA: Diagnosis not present

## 2016-09-26 DIAGNOSIS — C8292 Follicular lymphoma, unspecified, intrathoracic lymph nodes: Secondary | ICD-10-CM | POA: Diagnosis not present

## 2016-09-26 DIAGNOSIS — R1312 Dysphagia, oropharyngeal phase: Secondary | ICD-10-CM | POA: Diagnosis not present

## 2016-09-26 DIAGNOSIS — J449 Chronic obstructive pulmonary disease, unspecified: Secondary | ICD-10-CM | POA: Diagnosis not present

## 2016-09-26 DIAGNOSIS — R627 Adult failure to thrive: Secondary | ICD-10-CM | POA: Diagnosis not present

## 2016-09-29 DIAGNOSIS — J449 Chronic obstructive pulmonary disease, unspecified: Secondary | ICD-10-CM | POA: Diagnosis not present

## 2016-09-29 DIAGNOSIS — E43 Unspecified severe protein-calorie malnutrition: Secondary | ICD-10-CM | POA: Diagnosis not present

## 2016-09-29 DIAGNOSIS — R1312 Dysphagia, oropharyngeal phase: Secondary | ICD-10-CM | POA: Diagnosis not present

## 2016-09-29 DIAGNOSIS — C8292 Follicular lymphoma, unspecified, intrathoracic lymph nodes: Secondary | ICD-10-CM | POA: Diagnosis not present

## 2016-09-29 DIAGNOSIS — R627 Adult failure to thrive: Secondary | ICD-10-CM | POA: Diagnosis not present

## 2016-10-01 DIAGNOSIS — C8292 Follicular lymphoma, unspecified, intrathoracic lymph nodes: Secondary | ICD-10-CM | POA: Diagnosis not present

## 2016-10-01 DIAGNOSIS — R1312 Dysphagia, oropharyngeal phase: Secondary | ICD-10-CM | POA: Diagnosis not present

## 2016-10-01 DIAGNOSIS — E43 Unspecified severe protein-calorie malnutrition: Secondary | ICD-10-CM | POA: Diagnosis not present

## 2016-10-01 DIAGNOSIS — R627 Adult failure to thrive: Secondary | ICD-10-CM | POA: Diagnosis not present

## 2016-10-01 DIAGNOSIS — J449 Chronic obstructive pulmonary disease, unspecified: Secondary | ICD-10-CM | POA: Diagnosis not present

## 2016-10-02 DIAGNOSIS — R1312 Dysphagia, oropharyngeal phase: Secondary | ICD-10-CM | POA: Diagnosis not present

## 2016-10-02 DIAGNOSIS — R627 Adult failure to thrive: Secondary | ICD-10-CM | POA: Diagnosis not present

## 2016-10-02 DIAGNOSIS — E43 Unspecified severe protein-calorie malnutrition: Secondary | ICD-10-CM | POA: Diagnosis not present

## 2016-10-02 DIAGNOSIS — J449 Chronic obstructive pulmonary disease, unspecified: Secondary | ICD-10-CM | POA: Diagnosis not present

## 2016-10-02 DIAGNOSIS — C8292 Follicular lymphoma, unspecified, intrathoracic lymph nodes: Secondary | ICD-10-CM | POA: Diagnosis not present

## 2016-10-03 DIAGNOSIS — E43 Unspecified severe protein-calorie malnutrition: Secondary | ICD-10-CM | POA: Diagnosis not present

## 2016-10-03 DIAGNOSIS — J449 Chronic obstructive pulmonary disease, unspecified: Secondary | ICD-10-CM | POA: Diagnosis not present

## 2016-10-03 DIAGNOSIS — R1312 Dysphagia, oropharyngeal phase: Secondary | ICD-10-CM | POA: Diagnosis not present

## 2016-10-03 DIAGNOSIS — C8292 Follicular lymphoma, unspecified, intrathoracic lymph nodes: Secondary | ICD-10-CM | POA: Diagnosis not present

## 2016-10-03 DIAGNOSIS — R627 Adult failure to thrive: Secondary | ICD-10-CM | POA: Diagnosis not present

## 2016-10-04 DIAGNOSIS — R1312 Dysphagia, oropharyngeal phase: Secondary | ICD-10-CM | POA: Diagnosis not present

## 2016-10-04 DIAGNOSIS — R627 Adult failure to thrive: Secondary | ICD-10-CM | POA: Diagnosis not present

## 2016-10-04 DIAGNOSIS — C8292 Follicular lymphoma, unspecified, intrathoracic lymph nodes: Secondary | ICD-10-CM | POA: Diagnosis not present

## 2016-10-04 DIAGNOSIS — J449 Chronic obstructive pulmonary disease, unspecified: Secondary | ICD-10-CM | POA: Diagnosis not present

## 2016-10-04 DIAGNOSIS — E43 Unspecified severe protein-calorie malnutrition: Secondary | ICD-10-CM | POA: Diagnosis not present

## 2016-10-06 DIAGNOSIS — C8292 Follicular lymphoma, unspecified, intrathoracic lymph nodes: Secondary | ICD-10-CM | POA: Diagnosis not present

## 2016-10-06 DIAGNOSIS — R1312 Dysphagia, oropharyngeal phase: Secondary | ICD-10-CM | POA: Diagnosis not present

## 2016-10-06 DIAGNOSIS — R627 Adult failure to thrive: Secondary | ICD-10-CM | POA: Diagnosis not present

## 2016-10-06 DIAGNOSIS — J449 Chronic obstructive pulmonary disease, unspecified: Secondary | ICD-10-CM | POA: Diagnosis not present

## 2016-10-06 DIAGNOSIS — E43 Unspecified severe protein-calorie malnutrition: Secondary | ICD-10-CM | POA: Diagnosis not present

## 2016-10-08 DIAGNOSIS — E43 Unspecified severe protein-calorie malnutrition: Secondary | ICD-10-CM | POA: Diagnosis not present

## 2016-10-08 DIAGNOSIS — R1312 Dysphagia, oropharyngeal phase: Secondary | ICD-10-CM | POA: Diagnosis not present

## 2016-10-08 DIAGNOSIS — J449 Chronic obstructive pulmonary disease, unspecified: Secondary | ICD-10-CM | POA: Diagnosis not present

## 2016-10-08 DIAGNOSIS — C8292 Follicular lymphoma, unspecified, intrathoracic lymph nodes: Secondary | ICD-10-CM | POA: Diagnosis not present

## 2016-10-08 DIAGNOSIS — R627 Adult failure to thrive: Secondary | ICD-10-CM | POA: Diagnosis not present

## 2016-10-09 DIAGNOSIS — C8292 Follicular lymphoma, unspecified, intrathoracic lymph nodes: Secondary | ICD-10-CM | POA: Diagnosis not present

## 2016-10-09 DIAGNOSIS — R1312 Dysphagia, oropharyngeal phase: Secondary | ICD-10-CM | POA: Diagnosis not present

## 2016-10-09 DIAGNOSIS — J449 Chronic obstructive pulmonary disease, unspecified: Secondary | ICD-10-CM | POA: Diagnosis not present

## 2016-10-09 DIAGNOSIS — E43 Unspecified severe protein-calorie malnutrition: Secondary | ICD-10-CM | POA: Diagnosis not present

## 2016-10-09 DIAGNOSIS — R627 Adult failure to thrive: Secondary | ICD-10-CM | POA: Diagnosis not present

## 2016-10-10 DIAGNOSIS — R1312 Dysphagia, oropharyngeal phase: Secondary | ICD-10-CM | POA: Diagnosis not present

## 2016-10-10 DIAGNOSIS — C8292 Follicular lymphoma, unspecified, intrathoracic lymph nodes: Secondary | ICD-10-CM | POA: Diagnosis not present

## 2016-10-10 DIAGNOSIS — E43 Unspecified severe protein-calorie malnutrition: Secondary | ICD-10-CM | POA: Diagnosis not present

## 2016-10-10 DIAGNOSIS — R627 Adult failure to thrive: Secondary | ICD-10-CM | POA: Diagnosis not present

## 2016-10-10 DIAGNOSIS — J449 Chronic obstructive pulmonary disease, unspecified: Secondary | ICD-10-CM | POA: Diagnosis not present

## 2016-10-13 DIAGNOSIS — R1312 Dysphagia, oropharyngeal phase: Secondary | ICD-10-CM | POA: Diagnosis not present

## 2016-10-13 DIAGNOSIS — E43 Unspecified severe protein-calorie malnutrition: Secondary | ICD-10-CM | POA: Diagnosis not present

## 2016-10-13 DIAGNOSIS — R627 Adult failure to thrive: Secondary | ICD-10-CM | POA: Diagnosis not present

## 2016-10-13 DIAGNOSIS — C8292 Follicular lymphoma, unspecified, intrathoracic lymph nodes: Secondary | ICD-10-CM | POA: Diagnosis not present

## 2016-10-13 DIAGNOSIS — J449 Chronic obstructive pulmonary disease, unspecified: Secondary | ICD-10-CM | POA: Diagnosis not present

## 2016-10-14 DIAGNOSIS — C8292 Follicular lymphoma, unspecified, intrathoracic lymph nodes: Secondary | ICD-10-CM | POA: Diagnosis not present

## 2016-10-14 DIAGNOSIS — R1312 Dysphagia, oropharyngeal phase: Secondary | ICD-10-CM | POA: Diagnosis not present

## 2016-10-14 DIAGNOSIS — J449 Chronic obstructive pulmonary disease, unspecified: Secondary | ICD-10-CM | POA: Diagnosis not present

## 2016-10-14 DIAGNOSIS — E43 Unspecified severe protein-calorie malnutrition: Secondary | ICD-10-CM | POA: Diagnosis not present

## 2016-10-14 DIAGNOSIS — R627 Adult failure to thrive: Secondary | ICD-10-CM | POA: Diagnosis not present

## 2016-10-15 DIAGNOSIS — J449 Chronic obstructive pulmonary disease, unspecified: Secondary | ICD-10-CM | POA: Diagnosis not present

## 2016-10-15 DIAGNOSIS — R1312 Dysphagia, oropharyngeal phase: Secondary | ICD-10-CM | POA: Diagnosis not present

## 2016-10-15 DIAGNOSIS — E43 Unspecified severe protein-calorie malnutrition: Secondary | ICD-10-CM | POA: Diagnosis not present

## 2016-10-15 DIAGNOSIS — C8292 Follicular lymphoma, unspecified, intrathoracic lymph nodes: Secondary | ICD-10-CM | POA: Diagnosis not present

## 2016-10-15 DIAGNOSIS — R627 Adult failure to thrive: Secondary | ICD-10-CM | POA: Diagnosis not present

## 2016-10-16 DIAGNOSIS — R627 Adult failure to thrive: Secondary | ICD-10-CM | POA: Diagnosis not present

## 2016-10-16 DIAGNOSIS — R1312 Dysphagia, oropharyngeal phase: Secondary | ICD-10-CM | POA: Diagnosis not present

## 2016-10-16 DIAGNOSIS — J449 Chronic obstructive pulmonary disease, unspecified: Secondary | ICD-10-CM | POA: Diagnosis not present

## 2016-10-16 DIAGNOSIS — C8292 Follicular lymphoma, unspecified, intrathoracic lymph nodes: Secondary | ICD-10-CM | POA: Diagnosis not present

## 2016-10-16 DIAGNOSIS — E43 Unspecified severe protein-calorie malnutrition: Secondary | ICD-10-CM | POA: Diagnosis not present

## 2016-10-17 DIAGNOSIS — R627 Adult failure to thrive: Secondary | ICD-10-CM | POA: Diagnosis not present

## 2016-10-17 DIAGNOSIS — C8292 Follicular lymphoma, unspecified, intrathoracic lymph nodes: Secondary | ICD-10-CM | POA: Diagnosis not present

## 2016-10-17 DIAGNOSIS — R1312 Dysphagia, oropharyngeal phase: Secondary | ICD-10-CM | POA: Diagnosis not present

## 2016-10-17 DIAGNOSIS — E43 Unspecified severe protein-calorie malnutrition: Secondary | ICD-10-CM | POA: Diagnosis not present

## 2016-10-17 DIAGNOSIS — J449 Chronic obstructive pulmonary disease, unspecified: Secondary | ICD-10-CM | POA: Diagnosis not present

## 2016-10-20 DIAGNOSIS — C8292 Follicular lymphoma, unspecified, intrathoracic lymph nodes: Secondary | ICD-10-CM | POA: Diagnosis not present

## 2016-10-20 DIAGNOSIS — J449 Chronic obstructive pulmonary disease, unspecified: Secondary | ICD-10-CM | POA: Diagnosis not present

## 2016-10-20 DIAGNOSIS — R627 Adult failure to thrive: Secondary | ICD-10-CM | POA: Diagnosis not present

## 2016-10-20 DIAGNOSIS — R1312 Dysphagia, oropharyngeal phase: Secondary | ICD-10-CM | POA: Diagnosis not present

## 2016-10-20 DIAGNOSIS — E43 Unspecified severe protein-calorie malnutrition: Secondary | ICD-10-CM | POA: Diagnosis not present

## 2016-10-21 DIAGNOSIS — C8292 Follicular lymphoma, unspecified, intrathoracic lymph nodes: Secondary | ICD-10-CM | POA: Diagnosis not present

## 2016-10-21 DIAGNOSIS — J449 Chronic obstructive pulmonary disease, unspecified: Secondary | ICD-10-CM | POA: Diagnosis not present

## 2016-10-21 DIAGNOSIS — R1312 Dysphagia, oropharyngeal phase: Secondary | ICD-10-CM | POA: Diagnosis not present

## 2016-10-21 DIAGNOSIS — R627 Adult failure to thrive: Secondary | ICD-10-CM | POA: Diagnosis not present

## 2016-10-21 DIAGNOSIS — E43 Unspecified severe protein-calorie malnutrition: Secondary | ICD-10-CM | POA: Diagnosis not present

## 2016-10-22 DIAGNOSIS — R1312 Dysphagia, oropharyngeal phase: Secondary | ICD-10-CM | POA: Diagnosis not present

## 2016-10-22 DIAGNOSIS — E43 Unspecified severe protein-calorie malnutrition: Secondary | ICD-10-CM | POA: Diagnosis not present

## 2016-10-22 DIAGNOSIS — J449 Chronic obstructive pulmonary disease, unspecified: Secondary | ICD-10-CM | POA: Diagnosis not present

## 2016-10-22 DIAGNOSIS — R627 Adult failure to thrive: Secondary | ICD-10-CM | POA: Diagnosis not present

## 2016-10-22 DIAGNOSIS — C8292 Follicular lymphoma, unspecified, intrathoracic lymph nodes: Secondary | ICD-10-CM | POA: Diagnosis not present

## 2016-10-24 DIAGNOSIS — R1312 Dysphagia, oropharyngeal phase: Secondary | ICD-10-CM | POA: Diagnosis not present

## 2016-10-24 DIAGNOSIS — R627 Adult failure to thrive: Secondary | ICD-10-CM | POA: Diagnosis not present

## 2016-10-24 DIAGNOSIS — C8292 Follicular lymphoma, unspecified, intrathoracic lymph nodes: Secondary | ICD-10-CM | POA: Diagnosis not present

## 2016-10-24 DIAGNOSIS — E43 Unspecified severe protein-calorie malnutrition: Secondary | ICD-10-CM | POA: Diagnosis not present

## 2016-10-24 DIAGNOSIS — J449 Chronic obstructive pulmonary disease, unspecified: Secondary | ICD-10-CM | POA: Diagnosis not present

## 2016-10-25 DIAGNOSIS — J449 Chronic obstructive pulmonary disease, unspecified: Secondary | ICD-10-CM | POA: Diagnosis not present

## 2016-10-25 DIAGNOSIS — R1312 Dysphagia, oropharyngeal phase: Secondary | ICD-10-CM | POA: Diagnosis not present

## 2016-10-25 DIAGNOSIS — R627 Adult failure to thrive: Secondary | ICD-10-CM | POA: Diagnosis not present

## 2016-10-25 DIAGNOSIS — C8292 Follicular lymphoma, unspecified, intrathoracic lymph nodes: Secondary | ICD-10-CM | POA: Diagnosis not present

## 2016-10-25 DIAGNOSIS — E43 Unspecified severe protein-calorie malnutrition: Secondary | ICD-10-CM | POA: Diagnosis not present

## 2016-10-29 DIAGNOSIS — R627 Adult failure to thrive: Secondary | ICD-10-CM | POA: Diagnosis not present

## 2016-10-29 DIAGNOSIS — E43 Unspecified severe protein-calorie malnutrition: Secondary | ICD-10-CM | POA: Diagnosis not present

## 2016-10-29 DIAGNOSIS — R1312 Dysphagia, oropharyngeal phase: Secondary | ICD-10-CM | POA: Diagnosis not present

## 2016-10-29 DIAGNOSIS — J449 Chronic obstructive pulmonary disease, unspecified: Secondary | ICD-10-CM | POA: Diagnosis not present

## 2016-10-29 DIAGNOSIS — C8292 Follicular lymphoma, unspecified, intrathoracic lymph nodes: Secondary | ICD-10-CM | POA: Diagnosis not present

## 2016-10-31 DIAGNOSIS — C8292 Follicular lymphoma, unspecified, intrathoracic lymph nodes: Secondary | ICD-10-CM | POA: Diagnosis not present

## 2016-10-31 DIAGNOSIS — J449 Chronic obstructive pulmonary disease, unspecified: Secondary | ICD-10-CM | POA: Diagnosis not present

## 2016-10-31 DIAGNOSIS — E43 Unspecified severe protein-calorie malnutrition: Secondary | ICD-10-CM | POA: Diagnosis not present

## 2016-10-31 DIAGNOSIS — R1312 Dysphagia, oropharyngeal phase: Secondary | ICD-10-CM | POA: Diagnosis not present

## 2016-10-31 DIAGNOSIS — R627 Adult failure to thrive: Secondary | ICD-10-CM | POA: Diagnosis not present

## 2016-11-03 DIAGNOSIS — R627 Adult failure to thrive: Secondary | ICD-10-CM | POA: Diagnosis not present

## 2016-11-03 DIAGNOSIS — J449 Chronic obstructive pulmonary disease, unspecified: Secondary | ICD-10-CM | POA: Diagnosis not present

## 2016-11-03 DIAGNOSIS — R1312 Dysphagia, oropharyngeal phase: Secondary | ICD-10-CM | POA: Diagnosis not present

## 2016-11-03 DIAGNOSIS — E43 Unspecified severe protein-calorie malnutrition: Secondary | ICD-10-CM | POA: Diagnosis not present

## 2016-11-03 DIAGNOSIS — C8292 Follicular lymphoma, unspecified, intrathoracic lymph nodes: Secondary | ICD-10-CM | POA: Diagnosis not present

## 2016-11-04 DIAGNOSIS — J449 Chronic obstructive pulmonary disease, unspecified: Secondary | ICD-10-CM | POA: Diagnosis not present

## 2016-11-04 DIAGNOSIS — E43 Unspecified severe protein-calorie malnutrition: Secondary | ICD-10-CM | POA: Diagnosis not present

## 2016-11-04 DIAGNOSIS — C8292 Follicular lymphoma, unspecified, intrathoracic lymph nodes: Secondary | ICD-10-CM | POA: Diagnosis not present

## 2016-11-04 DIAGNOSIS — R1312 Dysphagia, oropharyngeal phase: Secondary | ICD-10-CM | POA: Diagnosis not present

## 2016-11-04 DIAGNOSIS — R627 Adult failure to thrive: Secondary | ICD-10-CM | POA: Diagnosis not present

## 2016-11-05 DIAGNOSIS — J449 Chronic obstructive pulmonary disease, unspecified: Secondary | ICD-10-CM | POA: Diagnosis not present

## 2016-11-05 DIAGNOSIS — E43 Unspecified severe protein-calorie malnutrition: Secondary | ICD-10-CM | POA: Diagnosis not present

## 2016-11-05 DIAGNOSIS — R627 Adult failure to thrive: Secondary | ICD-10-CM | POA: Diagnosis not present

## 2016-11-05 DIAGNOSIS — C8292 Follicular lymphoma, unspecified, intrathoracic lymph nodes: Secondary | ICD-10-CM | POA: Diagnosis not present

## 2016-11-05 DIAGNOSIS — R1312 Dysphagia, oropharyngeal phase: Secondary | ICD-10-CM | POA: Diagnosis not present

## 2016-11-07 DIAGNOSIS — C8292 Follicular lymphoma, unspecified, intrathoracic lymph nodes: Secondary | ICD-10-CM | POA: Diagnosis not present

## 2016-11-07 DIAGNOSIS — J449 Chronic obstructive pulmonary disease, unspecified: Secondary | ICD-10-CM | POA: Diagnosis not present

## 2016-11-07 DIAGNOSIS — R627 Adult failure to thrive: Secondary | ICD-10-CM | POA: Diagnosis not present

## 2016-11-07 DIAGNOSIS — R1312 Dysphagia, oropharyngeal phase: Secondary | ICD-10-CM | POA: Diagnosis not present

## 2016-11-07 DIAGNOSIS — E43 Unspecified severe protein-calorie malnutrition: Secondary | ICD-10-CM | POA: Diagnosis not present

## 2016-11-10 DIAGNOSIS — R1312 Dysphagia, oropharyngeal phase: Secondary | ICD-10-CM | POA: Diagnosis not present

## 2016-11-10 DIAGNOSIS — E43 Unspecified severe protein-calorie malnutrition: Secondary | ICD-10-CM | POA: Diagnosis not present

## 2016-11-10 DIAGNOSIS — R627 Adult failure to thrive: Secondary | ICD-10-CM | POA: Diagnosis not present

## 2016-11-10 DIAGNOSIS — C8292 Follicular lymphoma, unspecified, intrathoracic lymph nodes: Secondary | ICD-10-CM | POA: Diagnosis not present

## 2016-11-10 DIAGNOSIS — J449 Chronic obstructive pulmonary disease, unspecified: Secondary | ICD-10-CM | POA: Diagnosis not present

## 2016-11-11 DIAGNOSIS — C8292 Follicular lymphoma, unspecified, intrathoracic lymph nodes: Secondary | ICD-10-CM | POA: Diagnosis not present

## 2016-11-11 DIAGNOSIS — J449 Chronic obstructive pulmonary disease, unspecified: Secondary | ICD-10-CM | POA: Diagnosis not present

## 2016-11-11 DIAGNOSIS — E43 Unspecified severe protein-calorie malnutrition: Secondary | ICD-10-CM | POA: Diagnosis not present

## 2016-11-11 DIAGNOSIS — R1312 Dysphagia, oropharyngeal phase: Secondary | ICD-10-CM | POA: Diagnosis not present

## 2016-11-11 DIAGNOSIS — R627 Adult failure to thrive: Secondary | ICD-10-CM | POA: Diagnosis not present

## 2016-11-12 DIAGNOSIS — J449 Chronic obstructive pulmonary disease, unspecified: Secondary | ICD-10-CM | POA: Diagnosis not present

## 2016-11-12 DIAGNOSIS — E43 Unspecified severe protein-calorie malnutrition: Secondary | ICD-10-CM | POA: Diagnosis not present

## 2016-11-12 DIAGNOSIS — C8292 Follicular lymphoma, unspecified, intrathoracic lymph nodes: Secondary | ICD-10-CM | POA: Diagnosis not present

## 2016-11-12 DIAGNOSIS — R309 Painful micturition, unspecified: Secondary | ICD-10-CM | POA: Diagnosis not present

## 2016-11-12 DIAGNOSIS — R627 Adult failure to thrive: Secondary | ICD-10-CM | POA: Diagnosis not present

## 2016-11-12 DIAGNOSIS — R1312 Dysphagia, oropharyngeal phase: Secondary | ICD-10-CM | POA: Diagnosis not present

## 2016-11-14 DIAGNOSIS — E43 Unspecified severe protein-calorie malnutrition: Secondary | ICD-10-CM | POA: Diagnosis not present

## 2016-11-14 DIAGNOSIS — C8292 Follicular lymphoma, unspecified, intrathoracic lymph nodes: Secondary | ICD-10-CM | POA: Diagnosis not present

## 2016-11-14 DIAGNOSIS — R1312 Dysphagia, oropharyngeal phase: Secondary | ICD-10-CM | POA: Diagnosis not present

## 2016-11-14 DIAGNOSIS — J449 Chronic obstructive pulmonary disease, unspecified: Secondary | ICD-10-CM | POA: Diagnosis not present

## 2016-11-14 DIAGNOSIS — R627 Adult failure to thrive: Secondary | ICD-10-CM | POA: Diagnosis not present

## 2016-11-17 DIAGNOSIS — J449 Chronic obstructive pulmonary disease, unspecified: Secondary | ICD-10-CM | POA: Diagnosis not present

## 2016-11-17 DIAGNOSIS — R1312 Dysphagia, oropharyngeal phase: Secondary | ICD-10-CM | POA: Diagnosis not present

## 2016-11-17 DIAGNOSIS — C8292 Follicular lymphoma, unspecified, intrathoracic lymph nodes: Secondary | ICD-10-CM | POA: Diagnosis not present

## 2016-11-17 DIAGNOSIS — E43 Unspecified severe protein-calorie malnutrition: Secondary | ICD-10-CM | POA: Diagnosis not present

## 2016-11-17 DIAGNOSIS — R627 Adult failure to thrive: Secondary | ICD-10-CM | POA: Diagnosis not present

## 2016-11-19 DIAGNOSIS — R627 Adult failure to thrive: Secondary | ICD-10-CM | POA: Diagnosis not present

## 2016-11-19 DIAGNOSIS — C8292 Follicular lymphoma, unspecified, intrathoracic lymph nodes: Secondary | ICD-10-CM | POA: Diagnosis not present

## 2016-11-19 DIAGNOSIS — R1312 Dysphagia, oropharyngeal phase: Secondary | ICD-10-CM | POA: Diagnosis not present

## 2016-11-19 DIAGNOSIS — J449 Chronic obstructive pulmonary disease, unspecified: Secondary | ICD-10-CM | POA: Diagnosis not present

## 2016-11-19 DIAGNOSIS — E43 Unspecified severe protein-calorie malnutrition: Secondary | ICD-10-CM | POA: Diagnosis not present

## 2016-11-21 DIAGNOSIS — C8292 Follicular lymphoma, unspecified, intrathoracic lymph nodes: Secondary | ICD-10-CM | POA: Diagnosis not present

## 2016-11-21 DIAGNOSIS — E43 Unspecified severe protein-calorie malnutrition: Secondary | ICD-10-CM | POA: Diagnosis not present

## 2016-11-21 DIAGNOSIS — J449 Chronic obstructive pulmonary disease, unspecified: Secondary | ICD-10-CM | POA: Diagnosis not present

## 2016-11-21 DIAGNOSIS — R627 Adult failure to thrive: Secondary | ICD-10-CM | POA: Diagnosis not present

## 2016-11-21 DIAGNOSIS — R1312 Dysphagia, oropharyngeal phase: Secondary | ICD-10-CM | POA: Diagnosis not present

## 2016-11-24 DIAGNOSIS — E43 Unspecified severe protein-calorie malnutrition: Secondary | ICD-10-CM | POA: Diagnosis not present

## 2016-11-24 DIAGNOSIS — C8292 Follicular lymphoma, unspecified, intrathoracic lymph nodes: Secondary | ICD-10-CM | POA: Diagnosis not present

## 2016-11-24 DIAGNOSIS — R1312 Dysphagia, oropharyngeal phase: Secondary | ICD-10-CM | POA: Diagnosis not present

## 2016-11-24 DIAGNOSIS — J449 Chronic obstructive pulmonary disease, unspecified: Secondary | ICD-10-CM | POA: Diagnosis not present

## 2016-11-24 DIAGNOSIS — R627 Adult failure to thrive: Secondary | ICD-10-CM | POA: Diagnosis not present

## 2016-11-25 DIAGNOSIS — C8292 Follicular lymphoma, unspecified, intrathoracic lymph nodes: Secondary | ICD-10-CM | POA: Diagnosis not present

## 2016-11-25 DIAGNOSIS — R627 Adult failure to thrive: Secondary | ICD-10-CM | POA: Diagnosis not present

## 2016-11-25 DIAGNOSIS — J449 Chronic obstructive pulmonary disease, unspecified: Secondary | ICD-10-CM | POA: Diagnosis not present

## 2016-11-25 DIAGNOSIS — E43 Unspecified severe protein-calorie malnutrition: Secondary | ICD-10-CM | POA: Diagnosis not present

## 2016-11-25 DIAGNOSIS — R1312 Dysphagia, oropharyngeal phase: Secondary | ICD-10-CM | POA: Diagnosis not present

## 2016-11-26 DIAGNOSIS — C8292 Follicular lymphoma, unspecified, intrathoracic lymph nodes: Secondary | ICD-10-CM | POA: Diagnosis not present

## 2016-11-26 DIAGNOSIS — R627 Adult failure to thrive: Secondary | ICD-10-CM | POA: Diagnosis not present

## 2016-11-26 DIAGNOSIS — R1312 Dysphagia, oropharyngeal phase: Secondary | ICD-10-CM | POA: Diagnosis not present

## 2016-11-26 DIAGNOSIS — E43 Unspecified severe protein-calorie malnutrition: Secondary | ICD-10-CM | POA: Diagnosis not present

## 2016-11-26 DIAGNOSIS — J449 Chronic obstructive pulmonary disease, unspecified: Secondary | ICD-10-CM | POA: Diagnosis not present

## 2016-11-27 DIAGNOSIS — R1312 Dysphagia, oropharyngeal phase: Secondary | ICD-10-CM | POA: Diagnosis not present

## 2016-11-27 DIAGNOSIS — C8292 Follicular lymphoma, unspecified, intrathoracic lymph nodes: Secondary | ICD-10-CM | POA: Diagnosis not present

## 2016-11-27 DIAGNOSIS — R627 Adult failure to thrive: Secondary | ICD-10-CM | POA: Diagnosis not present

## 2016-11-27 DIAGNOSIS — J449 Chronic obstructive pulmonary disease, unspecified: Secondary | ICD-10-CM | POA: Diagnosis not present

## 2016-11-27 DIAGNOSIS — E43 Unspecified severe protein-calorie malnutrition: Secondary | ICD-10-CM | POA: Diagnosis not present

## 2016-11-28 DIAGNOSIS — C8292 Follicular lymphoma, unspecified, intrathoracic lymph nodes: Secondary | ICD-10-CM | POA: Diagnosis not present

## 2016-11-28 DIAGNOSIS — R627 Adult failure to thrive: Secondary | ICD-10-CM | POA: Diagnosis not present

## 2016-11-28 DIAGNOSIS — J449 Chronic obstructive pulmonary disease, unspecified: Secondary | ICD-10-CM | POA: Diagnosis not present

## 2016-11-28 DIAGNOSIS — E43 Unspecified severe protein-calorie malnutrition: Secondary | ICD-10-CM | POA: Diagnosis not present

## 2016-11-28 DIAGNOSIS — R1312 Dysphagia, oropharyngeal phase: Secondary | ICD-10-CM | POA: Diagnosis not present

## 2016-12-01 DIAGNOSIS — J449 Chronic obstructive pulmonary disease, unspecified: Secondary | ICD-10-CM | POA: Diagnosis not present

## 2016-12-01 DIAGNOSIS — R1312 Dysphagia, oropharyngeal phase: Secondary | ICD-10-CM | POA: Diagnosis not present

## 2016-12-01 DIAGNOSIS — C8292 Follicular lymphoma, unspecified, intrathoracic lymph nodes: Secondary | ICD-10-CM | POA: Diagnosis not present

## 2016-12-01 DIAGNOSIS — E43 Unspecified severe protein-calorie malnutrition: Secondary | ICD-10-CM | POA: Diagnosis not present

## 2016-12-01 DIAGNOSIS — R627 Adult failure to thrive: Secondary | ICD-10-CM | POA: Diagnosis not present

## 2016-12-03 DIAGNOSIS — E43 Unspecified severe protein-calorie malnutrition: Secondary | ICD-10-CM | POA: Diagnosis not present

## 2016-12-03 DIAGNOSIS — C8292 Follicular lymphoma, unspecified, intrathoracic lymph nodes: Secondary | ICD-10-CM | POA: Diagnosis not present

## 2016-12-03 DIAGNOSIS — R627 Adult failure to thrive: Secondary | ICD-10-CM | POA: Diagnosis not present

## 2016-12-03 DIAGNOSIS — R1312 Dysphagia, oropharyngeal phase: Secondary | ICD-10-CM | POA: Diagnosis not present

## 2016-12-03 DIAGNOSIS — J449 Chronic obstructive pulmonary disease, unspecified: Secondary | ICD-10-CM | POA: Diagnosis not present

## 2016-12-05 DIAGNOSIS — J449 Chronic obstructive pulmonary disease, unspecified: Secondary | ICD-10-CM | POA: Diagnosis not present

## 2016-12-05 DIAGNOSIS — R627 Adult failure to thrive: Secondary | ICD-10-CM | POA: Diagnosis not present

## 2016-12-05 DIAGNOSIS — R1312 Dysphagia, oropharyngeal phase: Secondary | ICD-10-CM | POA: Diagnosis not present

## 2016-12-05 DIAGNOSIS — E43 Unspecified severe protein-calorie malnutrition: Secondary | ICD-10-CM | POA: Diagnosis not present

## 2016-12-05 DIAGNOSIS — C8292 Follicular lymphoma, unspecified, intrathoracic lymph nodes: Secondary | ICD-10-CM | POA: Diagnosis not present

## 2016-12-08 DIAGNOSIS — R1312 Dysphagia, oropharyngeal phase: Secondary | ICD-10-CM | POA: Diagnosis not present

## 2016-12-08 DIAGNOSIS — R627 Adult failure to thrive: Secondary | ICD-10-CM | POA: Diagnosis not present

## 2016-12-08 DIAGNOSIS — E43 Unspecified severe protein-calorie malnutrition: Secondary | ICD-10-CM | POA: Diagnosis not present

## 2016-12-08 DIAGNOSIS — C8292 Follicular lymphoma, unspecified, intrathoracic lymph nodes: Secondary | ICD-10-CM | POA: Diagnosis not present

## 2016-12-08 DIAGNOSIS — J449 Chronic obstructive pulmonary disease, unspecified: Secondary | ICD-10-CM | POA: Diagnosis not present

## 2016-12-09 DIAGNOSIS — R1312 Dysphagia, oropharyngeal phase: Secondary | ICD-10-CM | POA: Diagnosis not present

## 2016-12-09 DIAGNOSIS — J449 Chronic obstructive pulmonary disease, unspecified: Secondary | ICD-10-CM | POA: Diagnosis not present

## 2016-12-09 DIAGNOSIS — C8292 Follicular lymphoma, unspecified, intrathoracic lymph nodes: Secondary | ICD-10-CM | POA: Diagnosis not present

## 2016-12-09 DIAGNOSIS — R627 Adult failure to thrive: Secondary | ICD-10-CM | POA: Diagnosis not present

## 2016-12-09 DIAGNOSIS — E43 Unspecified severe protein-calorie malnutrition: Secondary | ICD-10-CM | POA: Diagnosis not present

## 2016-12-10 DIAGNOSIS — R627 Adult failure to thrive: Secondary | ICD-10-CM | POA: Diagnosis not present

## 2016-12-10 DIAGNOSIS — J449 Chronic obstructive pulmonary disease, unspecified: Secondary | ICD-10-CM | POA: Diagnosis not present

## 2016-12-10 DIAGNOSIS — E43 Unspecified severe protein-calorie malnutrition: Secondary | ICD-10-CM | POA: Diagnosis not present

## 2016-12-10 DIAGNOSIS — R1312 Dysphagia, oropharyngeal phase: Secondary | ICD-10-CM | POA: Diagnosis not present

## 2016-12-10 DIAGNOSIS — C8292 Follicular lymphoma, unspecified, intrathoracic lymph nodes: Secondary | ICD-10-CM | POA: Diagnosis not present

## 2016-12-12 DIAGNOSIS — R627 Adult failure to thrive: Secondary | ICD-10-CM | POA: Diagnosis not present

## 2016-12-12 DIAGNOSIS — C8292 Follicular lymphoma, unspecified, intrathoracic lymph nodes: Secondary | ICD-10-CM | POA: Diagnosis not present

## 2016-12-12 DIAGNOSIS — R1312 Dysphagia, oropharyngeal phase: Secondary | ICD-10-CM | POA: Diagnosis not present

## 2016-12-12 DIAGNOSIS — E43 Unspecified severe protein-calorie malnutrition: Secondary | ICD-10-CM | POA: Diagnosis not present

## 2016-12-12 DIAGNOSIS — J449 Chronic obstructive pulmonary disease, unspecified: Secondary | ICD-10-CM | POA: Diagnosis not present

## 2016-12-15 DIAGNOSIS — R1312 Dysphagia, oropharyngeal phase: Secondary | ICD-10-CM | POA: Diagnosis not present

## 2016-12-15 DIAGNOSIS — J449 Chronic obstructive pulmonary disease, unspecified: Secondary | ICD-10-CM | POA: Diagnosis not present

## 2016-12-15 DIAGNOSIS — C8292 Follicular lymphoma, unspecified, intrathoracic lymph nodes: Secondary | ICD-10-CM | POA: Diagnosis not present

## 2016-12-15 DIAGNOSIS — E43 Unspecified severe protein-calorie malnutrition: Secondary | ICD-10-CM | POA: Diagnosis not present

## 2016-12-15 DIAGNOSIS — R627 Adult failure to thrive: Secondary | ICD-10-CM | POA: Diagnosis not present

## 2016-12-16 DIAGNOSIS — E43 Unspecified severe protein-calorie malnutrition: Secondary | ICD-10-CM | POA: Diagnosis not present

## 2016-12-16 DIAGNOSIS — J449 Chronic obstructive pulmonary disease, unspecified: Secondary | ICD-10-CM | POA: Diagnosis not present

## 2016-12-16 DIAGNOSIS — R1312 Dysphagia, oropharyngeal phase: Secondary | ICD-10-CM | POA: Diagnosis not present

## 2016-12-16 DIAGNOSIS — C8292 Follicular lymphoma, unspecified, intrathoracic lymph nodes: Secondary | ICD-10-CM | POA: Diagnosis not present

## 2016-12-16 DIAGNOSIS — R627 Adult failure to thrive: Secondary | ICD-10-CM | POA: Diagnosis not present

## 2016-12-17 DIAGNOSIS — E43 Unspecified severe protein-calorie malnutrition: Secondary | ICD-10-CM | POA: Diagnosis not present

## 2016-12-17 DIAGNOSIS — C8292 Follicular lymphoma, unspecified, intrathoracic lymph nodes: Secondary | ICD-10-CM | POA: Diagnosis not present

## 2016-12-17 DIAGNOSIS — J449 Chronic obstructive pulmonary disease, unspecified: Secondary | ICD-10-CM | POA: Diagnosis not present

## 2016-12-17 DIAGNOSIS — R1312 Dysphagia, oropharyngeal phase: Secondary | ICD-10-CM | POA: Diagnosis not present

## 2016-12-17 DIAGNOSIS — R627 Adult failure to thrive: Secondary | ICD-10-CM | POA: Diagnosis not present

## 2016-12-19 DIAGNOSIS — R627 Adult failure to thrive: Secondary | ICD-10-CM | POA: Diagnosis not present

## 2016-12-19 DIAGNOSIS — R1312 Dysphagia, oropharyngeal phase: Secondary | ICD-10-CM | POA: Diagnosis not present

## 2016-12-19 DIAGNOSIS — E43 Unspecified severe protein-calorie malnutrition: Secondary | ICD-10-CM | POA: Diagnosis not present

## 2016-12-19 DIAGNOSIS — C8292 Follicular lymphoma, unspecified, intrathoracic lymph nodes: Secondary | ICD-10-CM | POA: Diagnosis not present

## 2016-12-19 DIAGNOSIS — J449 Chronic obstructive pulmonary disease, unspecified: Secondary | ICD-10-CM | POA: Diagnosis not present

## 2016-12-22 DIAGNOSIS — E43 Unspecified severe protein-calorie malnutrition: Secondary | ICD-10-CM | POA: Diagnosis not present

## 2016-12-22 DIAGNOSIS — R627 Adult failure to thrive: Secondary | ICD-10-CM | POA: Diagnosis not present

## 2016-12-22 DIAGNOSIS — J449 Chronic obstructive pulmonary disease, unspecified: Secondary | ICD-10-CM | POA: Diagnosis not present

## 2016-12-22 DIAGNOSIS — R1312 Dysphagia, oropharyngeal phase: Secondary | ICD-10-CM | POA: Diagnosis not present

## 2016-12-22 DIAGNOSIS — C8292 Follicular lymphoma, unspecified, intrathoracic lymph nodes: Secondary | ICD-10-CM | POA: Diagnosis not present

## 2016-12-23 DIAGNOSIS — C8292 Follicular lymphoma, unspecified, intrathoracic lymph nodes: Secondary | ICD-10-CM | POA: Diagnosis not present

## 2016-12-23 DIAGNOSIS — R1312 Dysphagia, oropharyngeal phase: Secondary | ICD-10-CM | POA: Diagnosis not present

## 2016-12-23 DIAGNOSIS — R627 Adult failure to thrive: Secondary | ICD-10-CM | POA: Diagnosis not present

## 2016-12-23 DIAGNOSIS — E43 Unspecified severe protein-calorie malnutrition: Secondary | ICD-10-CM | POA: Diagnosis not present

## 2016-12-23 DIAGNOSIS — J449 Chronic obstructive pulmonary disease, unspecified: Secondary | ICD-10-CM | POA: Diagnosis not present

## 2016-12-24 DIAGNOSIS — C8292 Follicular lymphoma, unspecified, intrathoracic lymph nodes: Secondary | ICD-10-CM | POA: Diagnosis not present

## 2016-12-24 DIAGNOSIS — E43 Unspecified severe protein-calorie malnutrition: Secondary | ICD-10-CM | POA: Diagnosis not present

## 2016-12-24 DIAGNOSIS — J449 Chronic obstructive pulmonary disease, unspecified: Secondary | ICD-10-CM | POA: Diagnosis not present

## 2016-12-24 DIAGNOSIS — R1312 Dysphagia, oropharyngeal phase: Secondary | ICD-10-CM | POA: Diagnosis not present

## 2016-12-24 DIAGNOSIS — R627 Adult failure to thrive: Secondary | ICD-10-CM | POA: Diagnosis not present

## 2016-12-25 DIAGNOSIS — E43 Unspecified severe protein-calorie malnutrition: Secondary | ICD-10-CM | POA: Diagnosis not present

## 2016-12-25 DIAGNOSIS — R1312 Dysphagia, oropharyngeal phase: Secondary | ICD-10-CM | POA: Diagnosis not present

## 2016-12-25 DIAGNOSIS — R627 Adult failure to thrive: Secondary | ICD-10-CM | POA: Diagnosis not present

## 2016-12-25 DIAGNOSIS — C8292 Follicular lymphoma, unspecified, intrathoracic lymph nodes: Secondary | ICD-10-CM | POA: Diagnosis not present

## 2016-12-25 DIAGNOSIS — J449 Chronic obstructive pulmonary disease, unspecified: Secondary | ICD-10-CM | POA: Diagnosis not present

## 2016-12-26 DIAGNOSIS — R1312 Dysphagia, oropharyngeal phase: Secondary | ICD-10-CM | POA: Diagnosis not present

## 2016-12-26 DIAGNOSIS — R627 Adult failure to thrive: Secondary | ICD-10-CM | POA: Diagnosis not present

## 2016-12-26 DIAGNOSIS — C8292 Follicular lymphoma, unspecified, intrathoracic lymph nodes: Secondary | ICD-10-CM | POA: Diagnosis not present

## 2016-12-26 DIAGNOSIS — J449 Chronic obstructive pulmonary disease, unspecified: Secondary | ICD-10-CM | POA: Diagnosis not present

## 2016-12-26 DIAGNOSIS — E43 Unspecified severe protein-calorie malnutrition: Secondary | ICD-10-CM | POA: Diagnosis not present

## 2016-12-29 DIAGNOSIS — R627 Adult failure to thrive: Secondary | ICD-10-CM | POA: Diagnosis not present

## 2016-12-29 DIAGNOSIS — J449 Chronic obstructive pulmonary disease, unspecified: Secondary | ICD-10-CM | POA: Diagnosis not present

## 2016-12-29 DIAGNOSIS — C8292 Follicular lymphoma, unspecified, intrathoracic lymph nodes: Secondary | ICD-10-CM | POA: Diagnosis not present

## 2016-12-29 DIAGNOSIS — R1312 Dysphagia, oropharyngeal phase: Secondary | ICD-10-CM | POA: Diagnosis not present

## 2016-12-29 DIAGNOSIS — E43 Unspecified severe protein-calorie malnutrition: Secondary | ICD-10-CM | POA: Diagnosis not present

## 2016-12-31 DIAGNOSIS — J449 Chronic obstructive pulmonary disease, unspecified: Secondary | ICD-10-CM | POA: Diagnosis not present

## 2016-12-31 DIAGNOSIS — E43 Unspecified severe protein-calorie malnutrition: Secondary | ICD-10-CM | POA: Diagnosis not present

## 2016-12-31 DIAGNOSIS — R1312 Dysphagia, oropharyngeal phase: Secondary | ICD-10-CM | POA: Diagnosis not present

## 2016-12-31 DIAGNOSIS — R627 Adult failure to thrive: Secondary | ICD-10-CM | POA: Diagnosis not present

## 2016-12-31 DIAGNOSIS — C8292 Follicular lymphoma, unspecified, intrathoracic lymph nodes: Secondary | ICD-10-CM | POA: Diagnosis not present

## 2017-01-02 DIAGNOSIS — R1312 Dysphagia, oropharyngeal phase: Secondary | ICD-10-CM | POA: Diagnosis not present

## 2017-01-02 DIAGNOSIS — C8292 Follicular lymphoma, unspecified, intrathoracic lymph nodes: Secondary | ICD-10-CM | POA: Diagnosis not present

## 2017-01-02 DIAGNOSIS — R627 Adult failure to thrive: Secondary | ICD-10-CM | POA: Diagnosis not present

## 2017-01-02 DIAGNOSIS — E43 Unspecified severe protein-calorie malnutrition: Secondary | ICD-10-CM | POA: Diagnosis not present

## 2017-01-02 DIAGNOSIS — J449 Chronic obstructive pulmonary disease, unspecified: Secondary | ICD-10-CM | POA: Diagnosis not present

## 2017-01-05 DIAGNOSIS — C8292 Follicular lymphoma, unspecified, intrathoracic lymph nodes: Secondary | ICD-10-CM | POA: Diagnosis not present

## 2017-01-05 DIAGNOSIS — E43 Unspecified severe protein-calorie malnutrition: Secondary | ICD-10-CM | POA: Diagnosis not present

## 2017-01-05 DIAGNOSIS — J449 Chronic obstructive pulmonary disease, unspecified: Secondary | ICD-10-CM | POA: Diagnosis not present

## 2017-01-05 DIAGNOSIS — R1312 Dysphagia, oropharyngeal phase: Secondary | ICD-10-CM | POA: Diagnosis not present

## 2017-01-05 DIAGNOSIS — R627 Adult failure to thrive: Secondary | ICD-10-CM | POA: Diagnosis not present

## 2017-01-07 DIAGNOSIS — R1312 Dysphagia, oropharyngeal phase: Secondary | ICD-10-CM | POA: Diagnosis not present

## 2017-01-07 DIAGNOSIS — E43 Unspecified severe protein-calorie malnutrition: Secondary | ICD-10-CM | POA: Diagnosis not present

## 2017-01-07 DIAGNOSIS — C8292 Follicular lymphoma, unspecified, intrathoracic lymph nodes: Secondary | ICD-10-CM | POA: Diagnosis not present

## 2017-01-07 DIAGNOSIS — R627 Adult failure to thrive: Secondary | ICD-10-CM | POA: Diagnosis not present

## 2017-01-07 DIAGNOSIS — J449 Chronic obstructive pulmonary disease, unspecified: Secondary | ICD-10-CM | POA: Diagnosis not present

## 2017-01-09 DIAGNOSIS — R627 Adult failure to thrive: Secondary | ICD-10-CM | POA: Diagnosis not present

## 2017-01-09 DIAGNOSIS — C8292 Follicular lymphoma, unspecified, intrathoracic lymph nodes: Secondary | ICD-10-CM | POA: Diagnosis not present

## 2017-01-09 DIAGNOSIS — R1312 Dysphagia, oropharyngeal phase: Secondary | ICD-10-CM | POA: Diagnosis not present

## 2017-01-09 DIAGNOSIS — R4182 Altered mental status, unspecified: Secondary | ICD-10-CM | POA: Diagnosis not present

## 2017-01-09 DIAGNOSIS — J449 Chronic obstructive pulmonary disease, unspecified: Secondary | ICD-10-CM | POA: Diagnosis not present

## 2017-01-09 DIAGNOSIS — E43 Unspecified severe protein-calorie malnutrition: Secondary | ICD-10-CM | POA: Diagnosis not present

## 2017-01-12 DIAGNOSIS — J449 Chronic obstructive pulmonary disease, unspecified: Secondary | ICD-10-CM | POA: Diagnosis not present

## 2017-01-12 DIAGNOSIS — R627 Adult failure to thrive: Secondary | ICD-10-CM | POA: Diagnosis not present

## 2017-01-12 DIAGNOSIS — R1312 Dysphagia, oropharyngeal phase: Secondary | ICD-10-CM | POA: Diagnosis not present

## 2017-01-12 DIAGNOSIS — E43 Unspecified severe protein-calorie malnutrition: Secondary | ICD-10-CM | POA: Diagnosis not present

## 2017-01-12 DIAGNOSIS — C8292 Follicular lymphoma, unspecified, intrathoracic lymph nodes: Secondary | ICD-10-CM | POA: Diagnosis not present

## 2017-01-14 DIAGNOSIS — C8292 Follicular lymphoma, unspecified, intrathoracic lymph nodes: Secondary | ICD-10-CM | POA: Diagnosis not present

## 2017-01-14 DIAGNOSIS — R627 Adult failure to thrive: Secondary | ICD-10-CM | POA: Diagnosis not present

## 2017-01-14 DIAGNOSIS — R1312 Dysphagia, oropharyngeal phase: Secondary | ICD-10-CM | POA: Diagnosis not present

## 2017-01-14 DIAGNOSIS — E43 Unspecified severe protein-calorie malnutrition: Secondary | ICD-10-CM | POA: Diagnosis not present

## 2017-01-14 DIAGNOSIS — J449 Chronic obstructive pulmonary disease, unspecified: Secondary | ICD-10-CM | POA: Diagnosis not present

## 2017-01-16 DIAGNOSIS — E43 Unspecified severe protein-calorie malnutrition: Secondary | ICD-10-CM | POA: Diagnosis not present

## 2017-01-16 DIAGNOSIS — R1312 Dysphagia, oropharyngeal phase: Secondary | ICD-10-CM | POA: Diagnosis not present

## 2017-01-16 DIAGNOSIS — J449 Chronic obstructive pulmonary disease, unspecified: Secondary | ICD-10-CM | POA: Diagnosis not present

## 2017-01-16 DIAGNOSIS — C8292 Follicular lymphoma, unspecified, intrathoracic lymph nodes: Secondary | ICD-10-CM | POA: Diagnosis not present

## 2017-01-16 DIAGNOSIS — R627 Adult failure to thrive: Secondary | ICD-10-CM | POA: Diagnosis not present

## 2017-01-19 DIAGNOSIS — R1312 Dysphagia, oropharyngeal phase: Secondary | ICD-10-CM | POA: Diagnosis not present

## 2017-01-19 DIAGNOSIS — J449 Chronic obstructive pulmonary disease, unspecified: Secondary | ICD-10-CM | POA: Diagnosis not present

## 2017-01-19 DIAGNOSIS — C8292 Follicular lymphoma, unspecified, intrathoracic lymph nodes: Secondary | ICD-10-CM | POA: Diagnosis not present

## 2017-01-19 DIAGNOSIS — E43 Unspecified severe protein-calorie malnutrition: Secondary | ICD-10-CM | POA: Diagnosis not present

## 2017-01-19 DIAGNOSIS — R627 Adult failure to thrive: Secondary | ICD-10-CM | POA: Diagnosis not present

## 2017-01-20 DIAGNOSIS — R627 Adult failure to thrive: Secondary | ICD-10-CM | POA: Diagnosis not present

## 2017-01-20 DIAGNOSIS — J449 Chronic obstructive pulmonary disease, unspecified: Secondary | ICD-10-CM | POA: Diagnosis not present

## 2017-01-20 DIAGNOSIS — R1312 Dysphagia, oropharyngeal phase: Secondary | ICD-10-CM | POA: Diagnosis not present

## 2017-01-20 DIAGNOSIS — E43 Unspecified severe protein-calorie malnutrition: Secondary | ICD-10-CM | POA: Diagnosis not present

## 2017-01-20 DIAGNOSIS — C8292 Follicular lymphoma, unspecified, intrathoracic lymph nodes: Secondary | ICD-10-CM | POA: Diagnosis not present

## 2017-01-21 DIAGNOSIS — R1312 Dysphagia, oropharyngeal phase: Secondary | ICD-10-CM | POA: Diagnosis not present

## 2017-01-21 DIAGNOSIS — J449 Chronic obstructive pulmonary disease, unspecified: Secondary | ICD-10-CM | POA: Diagnosis not present

## 2017-01-21 DIAGNOSIS — C8292 Follicular lymphoma, unspecified, intrathoracic lymph nodes: Secondary | ICD-10-CM | POA: Diagnosis not present

## 2017-01-21 DIAGNOSIS — E43 Unspecified severe protein-calorie malnutrition: Secondary | ICD-10-CM | POA: Diagnosis not present

## 2017-01-21 DIAGNOSIS — R627 Adult failure to thrive: Secondary | ICD-10-CM | POA: Diagnosis not present

## 2017-01-22 DIAGNOSIS — R627 Adult failure to thrive: Secondary | ICD-10-CM | POA: Diagnosis not present

## 2017-01-22 DIAGNOSIS — R1312 Dysphagia, oropharyngeal phase: Secondary | ICD-10-CM | POA: Diagnosis not present

## 2017-01-22 DIAGNOSIS — C8292 Follicular lymphoma, unspecified, intrathoracic lymph nodes: Secondary | ICD-10-CM | POA: Diagnosis not present

## 2017-01-22 DIAGNOSIS — E43 Unspecified severe protein-calorie malnutrition: Secondary | ICD-10-CM | POA: Diagnosis not present

## 2017-01-22 DIAGNOSIS — J449 Chronic obstructive pulmonary disease, unspecified: Secondary | ICD-10-CM | POA: Diagnosis not present

## 2017-01-23 DIAGNOSIS — J449 Chronic obstructive pulmonary disease, unspecified: Secondary | ICD-10-CM | POA: Diagnosis not present

## 2017-01-23 DIAGNOSIS — C8292 Follicular lymphoma, unspecified, intrathoracic lymph nodes: Secondary | ICD-10-CM | POA: Diagnosis not present

## 2017-01-23 DIAGNOSIS — R1312 Dysphagia, oropharyngeal phase: Secondary | ICD-10-CM | POA: Diagnosis not present

## 2017-01-23 DIAGNOSIS — E43 Unspecified severe protein-calorie malnutrition: Secondary | ICD-10-CM | POA: Diagnosis not present

## 2017-01-23 DIAGNOSIS — R627 Adult failure to thrive: Secondary | ICD-10-CM | POA: Diagnosis not present

## 2017-01-24 DIAGNOSIS — J449 Chronic obstructive pulmonary disease, unspecified: Secondary | ICD-10-CM | POA: Diagnosis not present

## 2017-01-24 DIAGNOSIS — C8292 Follicular lymphoma, unspecified, intrathoracic lymph nodes: Secondary | ICD-10-CM | POA: Diagnosis not present

## 2017-01-24 DIAGNOSIS — R627 Adult failure to thrive: Secondary | ICD-10-CM | POA: Diagnosis not present

## 2017-01-24 DIAGNOSIS — E43 Unspecified severe protein-calorie malnutrition: Secondary | ICD-10-CM | POA: Diagnosis not present

## 2017-01-24 DIAGNOSIS — R1312 Dysphagia, oropharyngeal phase: Secondary | ICD-10-CM | POA: Diagnosis not present

## 2017-01-27 DIAGNOSIS — E43 Unspecified severe protein-calorie malnutrition: Secondary | ICD-10-CM | POA: Diagnosis not present

## 2017-01-27 DIAGNOSIS — R627 Adult failure to thrive: Secondary | ICD-10-CM | POA: Diagnosis not present

## 2017-01-27 DIAGNOSIS — C8292 Follicular lymphoma, unspecified, intrathoracic lymph nodes: Secondary | ICD-10-CM | POA: Diagnosis not present

## 2017-01-27 DIAGNOSIS — J449 Chronic obstructive pulmonary disease, unspecified: Secondary | ICD-10-CM | POA: Diagnosis not present

## 2017-01-27 DIAGNOSIS — R1312 Dysphagia, oropharyngeal phase: Secondary | ICD-10-CM | POA: Diagnosis not present

## 2017-01-28 DIAGNOSIS — J449 Chronic obstructive pulmonary disease, unspecified: Secondary | ICD-10-CM | POA: Diagnosis not present

## 2017-01-28 DIAGNOSIS — C8292 Follicular lymphoma, unspecified, intrathoracic lymph nodes: Secondary | ICD-10-CM | POA: Diagnosis not present

## 2017-01-28 DIAGNOSIS — E43 Unspecified severe protein-calorie malnutrition: Secondary | ICD-10-CM | POA: Diagnosis not present

## 2017-01-28 DIAGNOSIS — R1312 Dysphagia, oropharyngeal phase: Secondary | ICD-10-CM | POA: Diagnosis not present

## 2017-01-28 DIAGNOSIS — R627 Adult failure to thrive: Secondary | ICD-10-CM | POA: Diagnosis not present

## 2017-01-29 DIAGNOSIS — E43 Unspecified severe protein-calorie malnutrition: Secondary | ICD-10-CM | POA: Diagnosis not present

## 2017-01-29 DIAGNOSIS — R627 Adult failure to thrive: Secondary | ICD-10-CM | POA: Diagnosis not present

## 2017-01-29 DIAGNOSIS — C8292 Follicular lymphoma, unspecified, intrathoracic lymph nodes: Secondary | ICD-10-CM | POA: Diagnosis not present

## 2017-01-29 DIAGNOSIS — J449 Chronic obstructive pulmonary disease, unspecified: Secondary | ICD-10-CM | POA: Diagnosis not present

## 2017-01-29 DIAGNOSIS — R1312 Dysphagia, oropharyngeal phase: Secondary | ICD-10-CM | POA: Diagnosis not present

## 2017-01-30 DIAGNOSIS — J449 Chronic obstructive pulmonary disease, unspecified: Secondary | ICD-10-CM | POA: Diagnosis not present

## 2017-01-30 DIAGNOSIS — R627 Adult failure to thrive: Secondary | ICD-10-CM | POA: Diagnosis not present

## 2017-01-30 DIAGNOSIS — E43 Unspecified severe protein-calorie malnutrition: Secondary | ICD-10-CM | POA: Diagnosis not present

## 2017-01-30 DIAGNOSIS — C8292 Follicular lymphoma, unspecified, intrathoracic lymph nodes: Secondary | ICD-10-CM | POA: Diagnosis not present

## 2017-01-30 DIAGNOSIS — R1312 Dysphagia, oropharyngeal phase: Secondary | ICD-10-CM | POA: Diagnosis not present

## 2017-02-02 DIAGNOSIS — C8292 Follicular lymphoma, unspecified, intrathoracic lymph nodes: Secondary | ICD-10-CM | POA: Diagnosis not present

## 2017-02-02 DIAGNOSIS — R1312 Dysphagia, oropharyngeal phase: Secondary | ICD-10-CM | POA: Diagnosis not present

## 2017-02-02 DIAGNOSIS — E43 Unspecified severe protein-calorie malnutrition: Secondary | ICD-10-CM | POA: Diagnosis not present

## 2017-02-02 DIAGNOSIS — J449 Chronic obstructive pulmonary disease, unspecified: Secondary | ICD-10-CM | POA: Diagnosis not present

## 2017-02-02 DIAGNOSIS — R627 Adult failure to thrive: Secondary | ICD-10-CM | POA: Diagnosis not present

## 2017-02-04 DIAGNOSIS — E43 Unspecified severe protein-calorie malnutrition: Secondary | ICD-10-CM | POA: Diagnosis not present

## 2017-02-04 DIAGNOSIS — C8292 Follicular lymphoma, unspecified, intrathoracic lymph nodes: Secondary | ICD-10-CM | POA: Diagnosis not present

## 2017-02-04 DIAGNOSIS — R627 Adult failure to thrive: Secondary | ICD-10-CM | POA: Diagnosis not present

## 2017-02-04 DIAGNOSIS — R1312 Dysphagia, oropharyngeal phase: Secondary | ICD-10-CM | POA: Diagnosis not present

## 2017-02-04 DIAGNOSIS — J449 Chronic obstructive pulmonary disease, unspecified: Secondary | ICD-10-CM | POA: Diagnosis not present

## 2017-02-06 DIAGNOSIS — R1312 Dysphagia, oropharyngeal phase: Secondary | ICD-10-CM | POA: Diagnosis not present

## 2017-02-06 DIAGNOSIS — E43 Unspecified severe protein-calorie malnutrition: Secondary | ICD-10-CM | POA: Diagnosis not present

## 2017-02-06 DIAGNOSIS — J449 Chronic obstructive pulmonary disease, unspecified: Secondary | ICD-10-CM | POA: Diagnosis not present

## 2017-02-06 DIAGNOSIS — C8292 Follicular lymphoma, unspecified, intrathoracic lymph nodes: Secondary | ICD-10-CM | POA: Diagnosis not present

## 2017-02-06 DIAGNOSIS — R627 Adult failure to thrive: Secondary | ICD-10-CM | POA: Diagnosis not present

## 2017-02-09 DIAGNOSIS — R627 Adult failure to thrive: Secondary | ICD-10-CM | POA: Diagnosis not present

## 2017-02-09 DIAGNOSIS — E43 Unspecified severe protein-calorie malnutrition: Secondary | ICD-10-CM | POA: Diagnosis not present

## 2017-02-09 DIAGNOSIS — C8292 Follicular lymphoma, unspecified, intrathoracic lymph nodes: Secondary | ICD-10-CM | POA: Diagnosis not present

## 2017-02-09 DIAGNOSIS — J449 Chronic obstructive pulmonary disease, unspecified: Secondary | ICD-10-CM | POA: Diagnosis not present

## 2017-02-09 DIAGNOSIS — R1312 Dysphagia, oropharyngeal phase: Secondary | ICD-10-CM | POA: Diagnosis not present

## 2017-02-10 DIAGNOSIS — R627 Adult failure to thrive: Secondary | ICD-10-CM | POA: Diagnosis not present

## 2017-02-10 DIAGNOSIS — R1312 Dysphagia, oropharyngeal phase: Secondary | ICD-10-CM | POA: Diagnosis not present

## 2017-02-10 DIAGNOSIS — C8292 Follicular lymphoma, unspecified, intrathoracic lymph nodes: Secondary | ICD-10-CM | POA: Diagnosis not present

## 2017-02-10 DIAGNOSIS — E43 Unspecified severe protein-calorie malnutrition: Secondary | ICD-10-CM | POA: Diagnosis not present

## 2017-02-10 DIAGNOSIS — J449 Chronic obstructive pulmonary disease, unspecified: Secondary | ICD-10-CM | POA: Diagnosis not present

## 2017-02-11 DIAGNOSIS — J449 Chronic obstructive pulmonary disease, unspecified: Secondary | ICD-10-CM | POA: Diagnosis not present

## 2017-02-11 DIAGNOSIS — R1312 Dysphagia, oropharyngeal phase: Secondary | ICD-10-CM | POA: Diagnosis not present

## 2017-02-11 DIAGNOSIS — C8292 Follicular lymphoma, unspecified, intrathoracic lymph nodes: Secondary | ICD-10-CM | POA: Diagnosis not present

## 2017-02-11 DIAGNOSIS — R627 Adult failure to thrive: Secondary | ICD-10-CM | POA: Diagnosis not present

## 2017-02-11 DIAGNOSIS — E43 Unspecified severe protein-calorie malnutrition: Secondary | ICD-10-CM | POA: Diagnosis not present

## 2017-02-12 DIAGNOSIS — R1312 Dysphagia, oropharyngeal phase: Secondary | ICD-10-CM | POA: Diagnosis not present

## 2017-02-12 DIAGNOSIS — E43 Unspecified severe protein-calorie malnutrition: Secondary | ICD-10-CM | POA: Diagnosis not present

## 2017-02-12 DIAGNOSIS — C8292 Follicular lymphoma, unspecified, intrathoracic lymph nodes: Secondary | ICD-10-CM | POA: Diagnosis not present

## 2017-02-12 DIAGNOSIS — R627 Adult failure to thrive: Secondary | ICD-10-CM | POA: Diagnosis not present

## 2017-02-12 DIAGNOSIS — J449 Chronic obstructive pulmonary disease, unspecified: Secondary | ICD-10-CM | POA: Diagnosis not present

## 2017-02-13 DIAGNOSIS — R1312 Dysphagia, oropharyngeal phase: Secondary | ICD-10-CM | POA: Diagnosis not present

## 2017-02-13 DIAGNOSIS — E43 Unspecified severe protein-calorie malnutrition: Secondary | ICD-10-CM | POA: Diagnosis not present

## 2017-02-13 DIAGNOSIS — R627 Adult failure to thrive: Secondary | ICD-10-CM | POA: Diagnosis not present

## 2017-02-13 DIAGNOSIS — C8292 Follicular lymphoma, unspecified, intrathoracic lymph nodes: Secondary | ICD-10-CM | POA: Diagnosis not present

## 2017-02-13 DIAGNOSIS — J449 Chronic obstructive pulmonary disease, unspecified: Secondary | ICD-10-CM | POA: Diagnosis not present

## 2017-02-18 DIAGNOSIS — C8292 Follicular lymphoma, unspecified, intrathoracic lymph nodes: Secondary | ICD-10-CM | POA: Diagnosis not present

## 2017-02-18 DIAGNOSIS — J449 Chronic obstructive pulmonary disease, unspecified: Secondary | ICD-10-CM | POA: Diagnosis not present

## 2017-02-18 DIAGNOSIS — R1312 Dysphagia, oropharyngeal phase: Secondary | ICD-10-CM | POA: Diagnosis not present

## 2017-02-18 DIAGNOSIS — R627 Adult failure to thrive: Secondary | ICD-10-CM | POA: Diagnosis not present

## 2017-02-18 DIAGNOSIS — E43 Unspecified severe protein-calorie malnutrition: Secondary | ICD-10-CM | POA: Diagnosis not present

## 2017-02-19 DIAGNOSIS — R627 Adult failure to thrive: Secondary | ICD-10-CM | POA: Diagnosis not present

## 2017-02-19 DIAGNOSIS — E43 Unspecified severe protein-calorie malnutrition: Secondary | ICD-10-CM | POA: Diagnosis not present

## 2017-02-19 DIAGNOSIS — R1312 Dysphagia, oropharyngeal phase: Secondary | ICD-10-CM | POA: Diagnosis not present

## 2017-02-19 DIAGNOSIS — J449 Chronic obstructive pulmonary disease, unspecified: Secondary | ICD-10-CM | POA: Diagnosis not present

## 2017-02-19 DIAGNOSIS — C8292 Follicular lymphoma, unspecified, intrathoracic lymph nodes: Secondary | ICD-10-CM | POA: Diagnosis not present

## 2017-02-20 DIAGNOSIS — E43 Unspecified severe protein-calorie malnutrition: Secondary | ICD-10-CM | POA: Diagnosis not present

## 2017-02-20 DIAGNOSIS — R627 Adult failure to thrive: Secondary | ICD-10-CM | POA: Diagnosis not present

## 2017-02-20 DIAGNOSIS — J449 Chronic obstructive pulmonary disease, unspecified: Secondary | ICD-10-CM | POA: Diagnosis not present

## 2017-02-20 DIAGNOSIS — C8292 Follicular lymphoma, unspecified, intrathoracic lymph nodes: Secondary | ICD-10-CM | POA: Diagnosis not present

## 2017-02-20 DIAGNOSIS — R1312 Dysphagia, oropharyngeal phase: Secondary | ICD-10-CM | POA: Diagnosis not present

## 2017-02-23 DIAGNOSIS — E43 Unspecified severe protein-calorie malnutrition: Secondary | ICD-10-CM | POA: Diagnosis not present

## 2017-02-23 DIAGNOSIS — R627 Adult failure to thrive: Secondary | ICD-10-CM | POA: Diagnosis not present

## 2017-02-23 DIAGNOSIS — J449 Chronic obstructive pulmonary disease, unspecified: Secondary | ICD-10-CM | POA: Diagnosis not present

## 2017-02-23 DIAGNOSIS — R1312 Dysphagia, oropharyngeal phase: Secondary | ICD-10-CM | POA: Diagnosis not present

## 2017-02-23 DIAGNOSIS — C8292 Follicular lymphoma, unspecified, intrathoracic lymph nodes: Secondary | ICD-10-CM | POA: Diagnosis not present

## 2017-02-24 DIAGNOSIS — C8292 Follicular lymphoma, unspecified, intrathoracic lymph nodes: Secondary | ICD-10-CM | POA: Diagnosis not present

## 2017-02-24 DIAGNOSIS — E43 Unspecified severe protein-calorie malnutrition: Secondary | ICD-10-CM | POA: Diagnosis not present

## 2017-02-24 DIAGNOSIS — R627 Adult failure to thrive: Secondary | ICD-10-CM | POA: Diagnosis not present

## 2017-02-24 DIAGNOSIS — J449 Chronic obstructive pulmonary disease, unspecified: Secondary | ICD-10-CM | POA: Diagnosis not present

## 2017-02-24 DIAGNOSIS — R1312 Dysphagia, oropharyngeal phase: Secondary | ICD-10-CM | POA: Diagnosis not present

## 2017-02-25 DIAGNOSIS — E43 Unspecified severe protein-calorie malnutrition: Secondary | ICD-10-CM | POA: Diagnosis not present

## 2017-02-25 DIAGNOSIS — R627 Adult failure to thrive: Secondary | ICD-10-CM | POA: Diagnosis not present

## 2017-02-25 DIAGNOSIS — R1312 Dysphagia, oropharyngeal phase: Secondary | ICD-10-CM | POA: Diagnosis not present

## 2017-02-25 DIAGNOSIS — C8292 Follicular lymphoma, unspecified, intrathoracic lymph nodes: Secondary | ICD-10-CM | POA: Diagnosis not present

## 2017-02-25 DIAGNOSIS — J449 Chronic obstructive pulmonary disease, unspecified: Secondary | ICD-10-CM | POA: Diagnosis not present

## 2017-02-26 DIAGNOSIS — R1312 Dysphagia, oropharyngeal phase: Secondary | ICD-10-CM | POA: Diagnosis not present

## 2017-02-26 DIAGNOSIS — R627 Adult failure to thrive: Secondary | ICD-10-CM | POA: Diagnosis not present

## 2017-02-26 DIAGNOSIS — J449 Chronic obstructive pulmonary disease, unspecified: Secondary | ICD-10-CM | POA: Diagnosis not present

## 2017-02-26 DIAGNOSIS — E43 Unspecified severe protein-calorie malnutrition: Secondary | ICD-10-CM | POA: Diagnosis not present

## 2017-02-26 DIAGNOSIS — C8292 Follicular lymphoma, unspecified, intrathoracic lymph nodes: Secondary | ICD-10-CM | POA: Diagnosis not present

## 2017-02-27 DIAGNOSIS — R1312 Dysphagia, oropharyngeal phase: Secondary | ICD-10-CM | POA: Diagnosis not present

## 2017-02-27 DIAGNOSIS — C8292 Follicular lymphoma, unspecified, intrathoracic lymph nodes: Secondary | ICD-10-CM | POA: Diagnosis not present

## 2017-02-27 DIAGNOSIS — R627 Adult failure to thrive: Secondary | ICD-10-CM | POA: Diagnosis not present

## 2017-02-27 DIAGNOSIS — J449 Chronic obstructive pulmonary disease, unspecified: Secondary | ICD-10-CM | POA: Diagnosis not present

## 2017-02-27 DIAGNOSIS — E43 Unspecified severe protein-calorie malnutrition: Secondary | ICD-10-CM | POA: Diagnosis not present

## 2017-03-02 DIAGNOSIS — R627 Adult failure to thrive: Secondary | ICD-10-CM | POA: Diagnosis not present

## 2017-03-02 DIAGNOSIS — R1312 Dysphagia, oropharyngeal phase: Secondary | ICD-10-CM | POA: Diagnosis not present

## 2017-03-02 DIAGNOSIS — J449 Chronic obstructive pulmonary disease, unspecified: Secondary | ICD-10-CM | POA: Diagnosis not present

## 2017-03-02 DIAGNOSIS — C8292 Follicular lymphoma, unspecified, intrathoracic lymph nodes: Secondary | ICD-10-CM | POA: Diagnosis not present

## 2017-03-02 DIAGNOSIS — E43 Unspecified severe protein-calorie malnutrition: Secondary | ICD-10-CM | POA: Diagnosis not present

## 2017-03-03 DIAGNOSIS — J449 Chronic obstructive pulmonary disease, unspecified: Secondary | ICD-10-CM | POA: Diagnosis not present

## 2017-03-03 DIAGNOSIS — R627 Adult failure to thrive: Secondary | ICD-10-CM | POA: Diagnosis not present

## 2017-03-03 DIAGNOSIS — C8292 Follicular lymphoma, unspecified, intrathoracic lymph nodes: Secondary | ICD-10-CM | POA: Diagnosis not present

## 2017-03-03 DIAGNOSIS — R1312 Dysphagia, oropharyngeal phase: Secondary | ICD-10-CM | POA: Diagnosis not present

## 2017-03-03 DIAGNOSIS — E43 Unspecified severe protein-calorie malnutrition: Secondary | ICD-10-CM | POA: Diagnosis not present

## 2017-03-04 DIAGNOSIS — E43 Unspecified severe protein-calorie malnutrition: Secondary | ICD-10-CM | POA: Diagnosis not present

## 2017-03-04 DIAGNOSIS — C8292 Follicular lymphoma, unspecified, intrathoracic lymph nodes: Secondary | ICD-10-CM | POA: Diagnosis not present

## 2017-03-04 DIAGNOSIS — R1312 Dysphagia, oropharyngeal phase: Secondary | ICD-10-CM | POA: Diagnosis not present

## 2017-03-04 DIAGNOSIS — R627 Adult failure to thrive: Secondary | ICD-10-CM | POA: Diagnosis not present

## 2017-03-04 DIAGNOSIS — J449 Chronic obstructive pulmonary disease, unspecified: Secondary | ICD-10-CM | POA: Diagnosis not present

## 2017-03-05 DIAGNOSIS — R627 Adult failure to thrive: Secondary | ICD-10-CM | POA: Diagnosis not present

## 2017-03-05 DIAGNOSIS — E43 Unspecified severe protein-calorie malnutrition: Secondary | ICD-10-CM | POA: Diagnosis not present

## 2017-03-05 DIAGNOSIS — J449 Chronic obstructive pulmonary disease, unspecified: Secondary | ICD-10-CM | POA: Diagnosis not present

## 2017-03-05 DIAGNOSIS — R1312 Dysphagia, oropharyngeal phase: Secondary | ICD-10-CM | POA: Diagnosis not present

## 2017-03-05 DIAGNOSIS — C8292 Follicular lymphoma, unspecified, intrathoracic lymph nodes: Secondary | ICD-10-CM | POA: Diagnosis not present

## 2017-03-06 DIAGNOSIS — R1312 Dysphagia, oropharyngeal phase: Secondary | ICD-10-CM | POA: Diagnosis not present

## 2017-03-06 DIAGNOSIS — J449 Chronic obstructive pulmonary disease, unspecified: Secondary | ICD-10-CM | POA: Diagnosis not present

## 2017-03-06 DIAGNOSIS — C8292 Follicular lymphoma, unspecified, intrathoracic lymph nodes: Secondary | ICD-10-CM | POA: Diagnosis not present

## 2017-03-06 DIAGNOSIS — E43 Unspecified severe protein-calorie malnutrition: Secondary | ICD-10-CM | POA: Diagnosis not present

## 2017-03-06 DIAGNOSIS — R627 Adult failure to thrive: Secondary | ICD-10-CM | POA: Diagnosis not present

## 2017-03-09 DIAGNOSIS — J449 Chronic obstructive pulmonary disease, unspecified: Secondary | ICD-10-CM | POA: Diagnosis not present

## 2017-03-09 DIAGNOSIS — C8292 Follicular lymphoma, unspecified, intrathoracic lymph nodes: Secondary | ICD-10-CM | POA: Diagnosis not present

## 2017-03-09 DIAGNOSIS — R1312 Dysphagia, oropharyngeal phase: Secondary | ICD-10-CM | POA: Diagnosis not present

## 2017-03-09 DIAGNOSIS — E43 Unspecified severe protein-calorie malnutrition: Secondary | ICD-10-CM | POA: Diagnosis not present

## 2017-03-09 DIAGNOSIS — R627 Adult failure to thrive: Secondary | ICD-10-CM | POA: Diagnosis not present

## 2017-03-10 DIAGNOSIS — E43 Unspecified severe protein-calorie malnutrition: Secondary | ICD-10-CM | POA: Diagnosis not present

## 2017-03-10 DIAGNOSIS — J449 Chronic obstructive pulmonary disease, unspecified: Secondary | ICD-10-CM | POA: Diagnosis not present

## 2017-03-10 DIAGNOSIS — R627 Adult failure to thrive: Secondary | ICD-10-CM | POA: Diagnosis not present

## 2017-03-10 DIAGNOSIS — R1312 Dysphagia, oropharyngeal phase: Secondary | ICD-10-CM | POA: Diagnosis not present

## 2017-03-10 DIAGNOSIS — C8292 Follicular lymphoma, unspecified, intrathoracic lymph nodes: Secondary | ICD-10-CM | POA: Diagnosis not present

## 2017-03-11 DIAGNOSIS — C8292 Follicular lymphoma, unspecified, intrathoracic lymph nodes: Secondary | ICD-10-CM | POA: Diagnosis not present

## 2017-03-11 DIAGNOSIS — R1312 Dysphagia, oropharyngeal phase: Secondary | ICD-10-CM | POA: Diagnosis not present

## 2017-03-11 DIAGNOSIS — R627 Adult failure to thrive: Secondary | ICD-10-CM | POA: Diagnosis not present

## 2017-03-11 DIAGNOSIS — J449 Chronic obstructive pulmonary disease, unspecified: Secondary | ICD-10-CM | POA: Diagnosis not present

## 2017-03-11 DIAGNOSIS — E43 Unspecified severe protein-calorie malnutrition: Secondary | ICD-10-CM | POA: Diagnosis not present

## 2017-03-13 DIAGNOSIS — J449 Chronic obstructive pulmonary disease, unspecified: Secondary | ICD-10-CM | POA: Diagnosis not present

## 2017-03-13 DIAGNOSIS — C8292 Follicular lymphoma, unspecified, intrathoracic lymph nodes: Secondary | ICD-10-CM | POA: Diagnosis not present

## 2017-03-13 DIAGNOSIS — R627 Adult failure to thrive: Secondary | ICD-10-CM | POA: Diagnosis not present

## 2017-03-13 DIAGNOSIS — E43 Unspecified severe protein-calorie malnutrition: Secondary | ICD-10-CM | POA: Diagnosis not present

## 2017-03-13 DIAGNOSIS — R1312 Dysphagia, oropharyngeal phase: Secondary | ICD-10-CM | POA: Diagnosis not present

## 2017-03-16 DIAGNOSIS — R1312 Dysphagia, oropharyngeal phase: Secondary | ICD-10-CM | POA: Diagnosis not present

## 2017-03-16 DIAGNOSIS — J449 Chronic obstructive pulmonary disease, unspecified: Secondary | ICD-10-CM | POA: Diagnosis not present

## 2017-03-16 DIAGNOSIS — E43 Unspecified severe protein-calorie malnutrition: Secondary | ICD-10-CM | POA: Diagnosis not present

## 2017-03-16 DIAGNOSIS — C8292 Follicular lymphoma, unspecified, intrathoracic lymph nodes: Secondary | ICD-10-CM | POA: Diagnosis not present

## 2017-03-16 DIAGNOSIS — R627 Adult failure to thrive: Secondary | ICD-10-CM | POA: Diagnosis not present

## 2017-03-18 DIAGNOSIS — E43 Unspecified severe protein-calorie malnutrition: Secondary | ICD-10-CM | POA: Diagnosis not present

## 2017-03-18 DIAGNOSIS — C8292 Follicular lymphoma, unspecified, intrathoracic lymph nodes: Secondary | ICD-10-CM | POA: Diagnosis not present

## 2017-03-18 DIAGNOSIS — J449 Chronic obstructive pulmonary disease, unspecified: Secondary | ICD-10-CM | POA: Diagnosis not present

## 2017-03-18 DIAGNOSIS — R1312 Dysphagia, oropharyngeal phase: Secondary | ICD-10-CM | POA: Diagnosis not present

## 2017-03-18 DIAGNOSIS — R627 Adult failure to thrive: Secondary | ICD-10-CM | POA: Diagnosis not present

## 2017-03-20 DIAGNOSIS — C8292 Follicular lymphoma, unspecified, intrathoracic lymph nodes: Secondary | ICD-10-CM | POA: Diagnosis not present

## 2017-03-20 DIAGNOSIS — E43 Unspecified severe protein-calorie malnutrition: Secondary | ICD-10-CM | POA: Diagnosis not present

## 2017-03-20 DIAGNOSIS — R627 Adult failure to thrive: Secondary | ICD-10-CM | POA: Diagnosis not present

## 2017-03-20 DIAGNOSIS — J449 Chronic obstructive pulmonary disease, unspecified: Secondary | ICD-10-CM | POA: Diagnosis not present

## 2017-03-20 DIAGNOSIS — R1312 Dysphagia, oropharyngeal phase: Secondary | ICD-10-CM | POA: Diagnosis not present

## 2017-03-23 DIAGNOSIS — R1312 Dysphagia, oropharyngeal phase: Secondary | ICD-10-CM | POA: Diagnosis not present

## 2017-03-23 DIAGNOSIS — J449 Chronic obstructive pulmonary disease, unspecified: Secondary | ICD-10-CM | POA: Diagnosis not present

## 2017-03-23 DIAGNOSIS — R627 Adult failure to thrive: Secondary | ICD-10-CM | POA: Diagnosis not present

## 2017-03-23 DIAGNOSIS — C8292 Follicular lymphoma, unspecified, intrathoracic lymph nodes: Secondary | ICD-10-CM | POA: Diagnosis not present

## 2017-03-23 DIAGNOSIS — E43 Unspecified severe protein-calorie malnutrition: Secondary | ICD-10-CM | POA: Diagnosis not present

## 2017-03-25 DIAGNOSIS — R1312 Dysphagia, oropharyngeal phase: Secondary | ICD-10-CM | POA: Diagnosis not present

## 2017-03-25 DIAGNOSIS — C8292 Follicular lymphoma, unspecified, intrathoracic lymph nodes: Secondary | ICD-10-CM | POA: Diagnosis not present

## 2017-03-25 DIAGNOSIS — R627 Adult failure to thrive: Secondary | ICD-10-CM | POA: Diagnosis not present

## 2017-03-25 DIAGNOSIS — E43 Unspecified severe protein-calorie malnutrition: Secondary | ICD-10-CM | POA: Diagnosis not present

## 2017-03-25 DIAGNOSIS — J449 Chronic obstructive pulmonary disease, unspecified: Secondary | ICD-10-CM | POA: Diagnosis not present

## 2017-03-26 DIAGNOSIS — R627 Adult failure to thrive: Secondary | ICD-10-CM | POA: Diagnosis not present

## 2017-03-26 DIAGNOSIS — E43 Unspecified severe protein-calorie malnutrition: Secondary | ICD-10-CM | POA: Diagnosis not present

## 2017-03-26 DIAGNOSIS — C8292 Follicular lymphoma, unspecified, intrathoracic lymph nodes: Secondary | ICD-10-CM | POA: Diagnosis not present

## 2017-03-26 DIAGNOSIS — J449 Chronic obstructive pulmonary disease, unspecified: Secondary | ICD-10-CM | POA: Diagnosis not present

## 2017-03-26 DIAGNOSIS — R1312 Dysphagia, oropharyngeal phase: Secondary | ICD-10-CM | POA: Diagnosis not present

## 2017-03-27 DIAGNOSIS — C8292 Follicular lymphoma, unspecified, intrathoracic lymph nodes: Secondary | ICD-10-CM | POA: Diagnosis not present

## 2017-03-27 DIAGNOSIS — J449 Chronic obstructive pulmonary disease, unspecified: Secondary | ICD-10-CM | POA: Diagnosis not present

## 2017-03-27 DIAGNOSIS — R1312 Dysphagia, oropharyngeal phase: Secondary | ICD-10-CM | POA: Diagnosis not present

## 2017-03-27 DIAGNOSIS — R627 Adult failure to thrive: Secondary | ICD-10-CM | POA: Diagnosis not present

## 2017-03-27 DIAGNOSIS — E43 Unspecified severe protein-calorie malnutrition: Secondary | ICD-10-CM | POA: Diagnosis not present

## 2017-03-30 DIAGNOSIS — R627 Adult failure to thrive: Secondary | ICD-10-CM | POA: Diagnosis not present

## 2017-03-30 DIAGNOSIS — C8292 Follicular lymphoma, unspecified, intrathoracic lymph nodes: Secondary | ICD-10-CM | POA: Diagnosis not present

## 2017-03-30 DIAGNOSIS — E43 Unspecified severe protein-calorie malnutrition: Secondary | ICD-10-CM | POA: Diagnosis not present

## 2017-03-30 DIAGNOSIS — R1312 Dysphagia, oropharyngeal phase: Secondary | ICD-10-CM | POA: Diagnosis not present

## 2017-03-30 DIAGNOSIS — J449 Chronic obstructive pulmonary disease, unspecified: Secondary | ICD-10-CM | POA: Diagnosis not present

## 2017-03-31 DIAGNOSIS — E43 Unspecified severe protein-calorie malnutrition: Secondary | ICD-10-CM | POA: Diagnosis not present

## 2017-03-31 DIAGNOSIS — R1312 Dysphagia, oropharyngeal phase: Secondary | ICD-10-CM | POA: Diagnosis not present

## 2017-03-31 DIAGNOSIS — C8292 Follicular lymphoma, unspecified, intrathoracic lymph nodes: Secondary | ICD-10-CM | POA: Diagnosis not present

## 2017-03-31 DIAGNOSIS — J449 Chronic obstructive pulmonary disease, unspecified: Secondary | ICD-10-CM | POA: Diagnosis not present

## 2017-03-31 DIAGNOSIS — R627 Adult failure to thrive: Secondary | ICD-10-CM | POA: Diagnosis not present

## 2017-04-01 DIAGNOSIS — C8292 Follicular lymphoma, unspecified, intrathoracic lymph nodes: Secondary | ICD-10-CM | POA: Diagnosis not present

## 2017-04-01 DIAGNOSIS — J449 Chronic obstructive pulmonary disease, unspecified: Secondary | ICD-10-CM | POA: Diagnosis not present

## 2017-04-01 DIAGNOSIS — R1312 Dysphagia, oropharyngeal phase: Secondary | ICD-10-CM | POA: Diagnosis not present

## 2017-04-01 DIAGNOSIS — R627 Adult failure to thrive: Secondary | ICD-10-CM | POA: Diagnosis not present

## 2017-04-01 DIAGNOSIS — E43 Unspecified severe protein-calorie malnutrition: Secondary | ICD-10-CM | POA: Diagnosis not present

## 2017-04-02 DIAGNOSIS — R627 Adult failure to thrive: Secondary | ICD-10-CM | POA: Diagnosis not present

## 2017-04-02 DIAGNOSIS — R1312 Dysphagia, oropharyngeal phase: Secondary | ICD-10-CM | POA: Diagnosis not present

## 2017-04-02 DIAGNOSIS — C8292 Follicular lymphoma, unspecified, intrathoracic lymph nodes: Secondary | ICD-10-CM | POA: Diagnosis not present

## 2017-04-02 DIAGNOSIS — J449 Chronic obstructive pulmonary disease, unspecified: Secondary | ICD-10-CM | POA: Diagnosis not present

## 2017-04-02 DIAGNOSIS — E43 Unspecified severe protein-calorie malnutrition: Secondary | ICD-10-CM | POA: Diagnosis not present

## 2017-04-03 DIAGNOSIS — E43 Unspecified severe protein-calorie malnutrition: Secondary | ICD-10-CM | POA: Diagnosis not present

## 2017-04-03 DIAGNOSIS — R627 Adult failure to thrive: Secondary | ICD-10-CM | POA: Diagnosis not present

## 2017-04-03 DIAGNOSIS — C8292 Follicular lymphoma, unspecified, intrathoracic lymph nodes: Secondary | ICD-10-CM | POA: Diagnosis not present

## 2017-04-03 DIAGNOSIS — J449 Chronic obstructive pulmonary disease, unspecified: Secondary | ICD-10-CM | POA: Diagnosis not present

## 2017-04-03 DIAGNOSIS — R1312 Dysphagia, oropharyngeal phase: Secondary | ICD-10-CM | POA: Diagnosis not present

## 2017-04-06 DIAGNOSIS — J449 Chronic obstructive pulmonary disease, unspecified: Secondary | ICD-10-CM | POA: Diagnosis not present

## 2017-04-06 DIAGNOSIS — C8292 Follicular lymphoma, unspecified, intrathoracic lymph nodes: Secondary | ICD-10-CM | POA: Diagnosis not present

## 2017-04-06 DIAGNOSIS — E43 Unspecified severe protein-calorie malnutrition: Secondary | ICD-10-CM | POA: Diagnosis not present

## 2017-04-06 DIAGNOSIS — R1312 Dysphagia, oropharyngeal phase: Secondary | ICD-10-CM | POA: Diagnosis not present

## 2017-04-06 DIAGNOSIS — R627 Adult failure to thrive: Secondary | ICD-10-CM | POA: Diagnosis not present

## 2017-04-08 DIAGNOSIS — E43 Unspecified severe protein-calorie malnutrition: Secondary | ICD-10-CM | POA: Diagnosis not present

## 2017-04-08 DIAGNOSIS — J449 Chronic obstructive pulmonary disease, unspecified: Secondary | ICD-10-CM | POA: Diagnosis not present

## 2017-04-08 DIAGNOSIS — R1312 Dysphagia, oropharyngeal phase: Secondary | ICD-10-CM | POA: Diagnosis not present

## 2017-04-08 DIAGNOSIS — R627 Adult failure to thrive: Secondary | ICD-10-CM | POA: Diagnosis not present

## 2017-04-08 DIAGNOSIS — C8292 Follicular lymphoma, unspecified, intrathoracic lymph nodes: Secondary | ICD-10-CM | POA: Diagnosis not present

## 2017-04-09 DIAGNOSIS — R627 Adult failure to thrive: Secondary | ICD-10-CM | POA: Diagnosis not present

## 2017-04-09 DIAGNOSIS — C8292 Follicular lymphoma, unspecified, intrathoracic lymph nodes: Secondary | ICD-10-CM | POA: Diagnosis not present

## 2017-04-09 DIAGNOSIS — R1312 Dysphagia, oropharyngeal phase: Secondary | ICD-10-CM | POA: Diagnosis not present

## 2017-04-09 DIAGNOSIS — J449 Chronic obstructive pulmonary disease, unspecified: Secondary | ICD-10-CM | POA: Diagnosis not present

## 2017-04-09 DIAGNOSIS — E43 Unspecified severe protein-calorie malnutrition: Secondary | ICD-10-CM | POA: Diagnosis not present

## 2017-04-10 DIAGNOSIS — R1312 Dysphagia, oropharyngeal phase: Secondary | ICD-10-CM | POA: Diagnosis not present

## 2017-04-10 DIAGNOSIS — J449 Chronic obstructive pulmonary disease, unspecified: Secondary | ICD-10-CM | POA: Diagnosis not present

## 2017-04-10 DIAGNOSIS — E43 Unspecified severe protein-calorie malnutrition: Secondary | ICD-10-CM | POA: Diagnosis not present

## 2017-04-10 DIAGNOSIS — R627 Adult failure to thrive: Secondary | ICD-10-CM | POA: Diagnosis not present

## 2017-04-10 DIAGNOSIS — C8292 Follicular lymphoma, unspecified, intrathoracic lymph nodes: Secondary | ICD-10-CM | POA: Diagnosis not present

## 2017-04-13 DIAGNOSIS — R627 Adult failure to thrive: Secondary | ICD-10-CM | POA: Diagnosis not present

## 2017-04-13 DIAGNOSIS — E43 Unspecified severe protein-calorie malnutrition: Secondary | ICD-10-CM | POA: Diagnosis not present

## 2017-04-13 DIAGNOSIS — R1312 Dysphagia, oropharyngeal phase: Secondary | ICD-10-CM | POA: Diagnosis not present

## 2017-04-13 DIAGNOSIS — C8292 Follicular lymphoma, unspecified, intrathoracic lymph nodes: Secondary | ICD-10-CM | POA: Diagnosis not present

## 2017-04-13 DIAGNOSIS — J449 Chronic obstructive pulmonary disease, unspecified: Secondary | ICD-10-CM | POA: Diagnosis not present

## 2017-04-16 DIAGNOSIS — J449 Chronic obstructive pulmonary disease, unspecified: Secondary | ICD-10-CM | POA: Diagnosis not present

## 2017-04-16 DIAGNOSIS — R627 Adult failure to thrive: Secondary | ICD-10-CM | POA: Diagnosis not present

## 2017-04-16 DIAGNOSIS — R1312 Dysphagia, oropharyngeal phase: Secondary | ICD-10-CM | POA: Diagnosis not present

## 2017-04-16 DIAGNOSIS — C8292 Follicular lymphoma, unspecified, intrathoracic lymph nodes: Secondary | ICD-10-CM | POA: Diagnosis not present

## 2017-04-16 DIAGNOSIS — E43 Unspecified severe protein-calorie malnutrition: Secondary | ICD-10-CM | POA: Diagnosis not present

## 2017-04-17 DIAGNOSIS — E43 Unspecified severe protein-calorie malnutrition: Secondary | ICD-10-CM | POA: Diagnosis not present

## 2017-04-17 DIAGNOSIS — J449 Chronic obstructive pulmonary disease, unspecified: Secondary | ICD-10-CM | POA: Diagnosis not present

## 2017-04-17 DIAGNOSIS — C8292 Follicular lymphoma, unspecified, intrathoracic lymph nodes: Secondary | ICD-10-CM | POA: Diagnosis not present

## 2017-04-17 DIAGNOSIS — R627 Adult failure to thrive: Secondary | ICD-10-CM | POA: Diagnosis not present

## 2017-04-17 DIAGNOSIS — R1312 Dysphagia, oropharyngeal phase: Secondary | ICD-10-CM | POA: Diagnosis not present

## 2017-04-20 DIAGNOSIS — C8292 Follicular lymphoma, unspecified, intrathoracic lymph nodes: Secondary | ICD-10-CM | POA: Diagnosis not present

## 2017-04-20 DIAGNOSIS — E43 Unspecified severe protein-calorie malnutrition: Secondary | ICD-10-CM | POA: Diagnosis not present

## 2017-04-20 DIAGNOSIS — J449 Chronic obstructive pulmonary disease, unspecified: Secondary | ICD-10-CM | POA: Diagnosis not present

## 2017-04-20 DIAGNOSIS — R627 Adult failure to thrive: Secondary | ICD-10-CM | POA: Diagnosis not present

## 2017-04-20 DIAGNOSIS — R1312 Dysphagia, oropharyngeal phase: Secondary | ICD-10-CM | POA: Diagnosis not present

## 2017-04-22 DIAGNOSIS — R627 Adult failure to thrive: Secondary | ICD-10-CM | POA: Diagnosis not present

## 2017-04-22 DIAGNOSIS — C8292 Follicular lymphoma, unspecified, intrathoracic lymph nodes: Secondary | ICD-10-CM | POA: Diagnosis not present

## 2017-04-22 DIAGNOSIS — E43 Unspecified severe protein-calorie malnutrition: Secondary | ICD-10-CM | POA: Diagnosis not present

## 2017-04-22 DIAGNOSIS — R1312 Dysphagia, oropharyngeal phase: Secondary | ICD-10-CM | POA: Diagnosis not present

## 2017-04-22 DIAGNOSIS — J449 Chronic obstructive pulmonary disease, unspecified: Secondary | ICD-10-CM | POA: Diagnosis not present

## 2017-04-23 DIAGNOSIS — C8292 Follicular lymphoma, unspecified, intrathoracic lymph nodes: Secondary | ICD-10-CM | POA: Diagnosis not present

## 2017-04-23 DIAGNOSIS — E43 Unspecified severe protein-calorie malnutrition: Secondary | ICD-10-CM | POA: Diagnosis not present

## 2017-04-23 DIAGNOSIS — R1312 Dysphagia, oropharyngeal phase: Secondary | ICD-10-CM | POA: Diagnosis not present

## 2017-04-23 DIAGNOSIS — R627 Adult failure to thrive: Secondary | ICD-10-CM | POA: Diagnosis not present

## 2017-04-23 DIAGNOSIS — J449 Chronic obstructive pulmonary disease, unspecified: Secondary | ICD-10-CM | POA: Diagnosis not present

## 2017-04-24 DIAGNOSIS — E43 Unspecified severe protein-calorie malnutrition: Secondary | ICD-10-CM | POA: Diagnosis not present

## 2017-04-24 DIAGNOSIS — R627 Adult failure to thrive: Secondary | ICD-10-CM | POA: Diagnosis not present

## 2017-04-24 DIAGNOSIS — C8292 Follicular lymphoma, unspecified, intrathoracic lymph nodes: Secondary | ICD-10-CM | POA: Diagnosis not present

## 2017-04-24 DIAGNOSIS — J449 Chronic obstructive pulmonary disease, unspecified: Secondary | ICD-10-CM | POA: Diagnosis not present

## 2017-04-24 DIAGNOSIS — R1312 Dysphagia, oropharyngeal phase: Secondary | ICD-10-CM | POA: Diagnosis not present

## 2017-04-27 DIAGNOSIS — J449 Chronic obstructive pulmonary disease, unspecified: Secondary | ICD-10-CM | POA: Diagnosis not present

## 2017-04-27 DIAGNOSIS — R1312 Dysphagia, oropharyngeal phase: Secondary | ICD-10-CM | POA: Diagnosis not present

## 2017-04-27 DIAGNOSIS — C8292 Follicular lymphoma, unspecified, intrathoracic lymph nodes: Secondary | ICD-10-CM | POA: Diagnosis not present

## 2017-04-27 DIAGNOSIS — R627 Adult failure to thrive: Secondary | ICD-10-CM | POA: Diagnosis not present

## 2017-04-27 DIAGNOSIS — E43 Unspecified severe protein-calorie malnutrition: Secondary | ICD-10-CM | POA: Diagnosis not present

## 2017-04-29 DIAGNOSIS — R627 Adult failure to thrive: Secondary | ICD-10-CM | POA: Diagnosis not present

## 2017-04-29 DIAGNOSIS — C8292 Follicular lymphoma, unspecified, intrathoracic lymph nodes: Secondary | ICD-10-CM | POA: Diagnosis not present

## 2017-04-29 DIAGNOSIS — E43 Unspecified severe protein-calorie malnutrition: Secondary | ICD-10-CM | POA: Diagnosis not present

## 2017-04-29 DIAGNOSIS — R1312 Dysphagia, oropharyngeal phase: Secondary | ICD-10-CM | POA: Diagnosis not present

## 2017-04-29 DIAGNOSIS — J449 Chronic obstructive pulmonary disease, unspecified: Secondary | ICD-10-CM | POA: Diagnosis not present

## 2017-04-30 DIAGNOSIS — E43 Unspecified severe protein-calorie malnutrition: Secondary | ICD-10-CM | POA: Diagnosis not present

## 2017-04-30 DIAGNOSIS — R627 Adult failure to thrive: Secondary | ICD-10-CM | POA: Diagnosis not present

## 2017-04-30 DIAGNOSIS — C8292 Follicular lymphoma, unspecified, intrathoracic lymph nodes: Secondary | ICD-10-CM | POA: Diagnosis not present

## 2017-04-30 DIAGNOSIS — J449 Chronic obstructive pulmonary disease, unspecified: Secondary | ICD-10-CM | POA: Diagnosis not present

## 2017-04-30 DIAGNOSIS — R1312 Dysphagia, oropharyngeal phase: Secondary | ICD-10-CM | POA: Diagnosis not present

## 2017-05-01 DIAGNOSIS — E43 Unspecified severe protein-calorie malnutrition: Secondary | ICD-10-CM | POA: Diagnosis not present

## 2017-05-01 DIAGNOSIS — C8292 Follicular lymphoma, unspecified, intrathoracic lymph nodes: Secondary | ICD-10-CM | POA: Diagnosis not present

## 2017-05-01 DIAGNOSIS — R627 Adult failure to thrive: Secondary | ICD-10-CM | POA: Diagnosis not present

## 2017-05-01 DIAGNOSIS — R1312 Dysphagia, oropharyngeal phase: Secondary | ICD-10-CM | POA: Diagnosis not present

## 2017-05-01 DIAGNOSIS — J449 Chronic obstructive pulmonary disease, unspecified: Secondary | ICD-10-CM | POA: Diagnosis not present

## 2017-05-04 DIAGNOSIS — E43 Unspecified severe protein-calorie malnutrition: Secondary | ICD-10-CM | POA: Diagnosis not present

## 2017-05-04 DIAGNOSIS — J449 Chronic obstructive pulmonary disease, unspecified: Secondary | ICD-10-CM | POA: Diagnosis not present

## 2017-05-04 DIAGNOSIS — R1312 Dysphagia, oropharyngeal phase: Secondary | ICD-10-CM | POA: Diagnosis not present

## 2017-05-04 DIAGNOSIS — R627 Adult failure to thrive: Secondary | ICD-10-CM | POA: Diagnosis not present

## 2017-05-04 DIAGNOSIS — C8292 Follicular lymphoma, unspecified, intrathoracic lymph nodes: Secondary | ICD-10-CM | POA: Diagnosis not present

## 2017-05-06 DIAGNOSIS — J449 Chronic obstructive pulmonary disease, unspecified: Secondary | ICD-10-CM | POA: Diagnosis not present

## 2017-05-06 DIAGNOSIS — E43 Unspecified severe protein-calorie malnutrition: Secondary | ICD-10-CM | POA: Diagnosis not present

## 2017-05-06 DIAGNOSIS — R627 Adult failure to thrive: Secondary | ICD-10-CM | POA: Diagnosis not present

## 2017-05-06 DIAGNOSIS — R1312 Dysphagia, oropharyngeal phase: Secondary | ICD-10-CM | POA: Diagnosis not present

## 2017-05-06 DIAGNOSIS — C8292 Follicular lymphoma, unspecified, intrathoracic lymph nodes: Secondary | ICD-10-CM | POA: Diagnosis not present

## 2017-05-08 DIAGNOSIS — J449 Chronic obstructive pulmonary disease, unspecified: Secondary | ICD-10-CM | POA: Diagnosis not present

## 2017-05-08 DIAGNOSIS — C8292 Follicular lymphoma, unspecified, intrathoracic lymph nodes: Secondary | ICD-10-CM | POA: Diagnosis not present

## 2017-05-08 DIAGNOSIS — E43 Unspecified severe protein-calorie malnutrition: Secondary | ICD-10-CM | POA: Diagnosis not present

## 2017-05-08 DIAGNOSIS — R1312 Dysphagia, oropharyngeal phase: Secondary | ICD-10-CM | POA: Diagnosis not present

## 2017-05-08 DIAGNOSIS — R627 Adult failure to thrive: Secondary | ICD-10-CM | POA: Diagnosis not present

## 2017-05-11 DIAGNOSIS — C8292 Follicular lymphoma, unspecified, intrathoracic lymph nodes: Secondary | ICD-10-CM | POA: Diagnosis not present

## 2017-05-11 DIAGNOSIS — J449 Chronic obstructive pulmonary disease, unspecified: Secondary | ICD-10-CM | POA: Diagnosis not present

## 2017-05-11 DIAGNOSIS — R1312 Dysphagia, oropharyngeal phase: Secondary | ICD-10-CM | POA: Diagnosis not present

## 2017-05-11 DIAGNOSIS — R627 Adult failure to thrive: Secondary | ICD-10-CM | POA: Diagnosis not present

## 2017-05-11 DIAGNOSIS — E43 Unspecified severe protein-calorie malnutrition: Secondary | ICD-10-CM | POA: Diagnosis not present

## 2017-05-13 DIAGNOSIS — R627 Adult failure to thrive: Secondary | ICD-10-CM | POA: Diagnosis not present

## 2017-05-13 DIAGNOSIS — E43 Unspecified severe protein-calorie malnutrition: Secondary | ICD-10-CM | POA: Diagnosis not present

## 2017-05-13 DIAGNOSIS — R1312 Dysphagia, oropharyngeal phase: Secondary | ICD-10-CM | POA: Diagnosis not present

## 2017-05-13 DIAGNOSIS — C8292 Follicular lymphoma, unspecified, intrathoracic lymph nodes: Secondary | ICD-10-CM | POA: Diagnosis not present

## 2017-05-13 DIAGNOSIS — J449 Chronic obstructive pulmonary disease, unspecified: Secondary | ICD-10-CM | POA: Diagnosis not present

## 2017-05-14 DIAGNOSIS — C8292 Follicular lymphoma, unspecified, intrathoracic lymph nodes: Secondary | ICD-10-CM | POA: Diagnosis not present

## 2017-05-14 DIAGNOSIS — J449 Chronic obstructive pulmonary disease, unspecified: Secondary | ICD-10-CM | POA: Diagnosis not present

## 2017-05-14 DIAGNOSIS — R627 Adult failure to thrive: Secondary | ICD-10-CM | POA: Diagnosis not present

## 2017-05-14 DIAGNOSIS — E43 Unspecified severe protein-calorie malnutrition: Secondary | ICD-10-CM | POA: Diagnosis not present

## 2017-05-14 DIAGNOSIS — R1312 Dysphagia, oropharyngeal phase: Secondary | ICD-10-CM | POA: Diagnosis not present

## 2017-05-15 DIAGNOSIS — R627 Adult failure to thrive: Secondary | ICD-10-CM | POA: Diagnosis not present

## 2017-05-15 DIAGNOSIS — C8292 Follicular lymphoma, unspecified, intrathoracic lymph nodes: Secondary | ICD-10-CM | POA: Diagnosis not present

## 2017-05-15 DIAGNOSIS — E43 Unspecified severe protein-calorie malnutrition: Secondary | ICD-10-CM | POA: Diagnosis not present

## 2017-05-15 DIAGNOSIS — J449 Chronic obstructive pulmonary disease, unspecified: Secondary | ICD-10-CM | POA: Diagnosis not present

## 2017-05-15 DIAGNOSIS — R1312 Dysphagia, oropharyngeal phase: Secondary | ICD-10-CM | POA: Diagnosis not present

## 2017-05-18 DIAGNOSIS — E43 Unspecified severe protein-calorie malnutrition: Secondary | ICD-10-CM | POA: Diagnosis not present

## 2017-05-18 DIAGNOSIS — J449 Chronic obstructive pulmonary disease, unspecified: Secondary | ICD-10-CM | POA: Diagnosis not present

## 2017-05-18 DIAGNOSIS — R627 Adult failure to thrive: Secondary | ICD-10-CM | POA: Diagnosis not present

## 2017-05-18 DIAGNOSIS — C8292 Follicular lymphoma, unspecified, intrathoracic lymph nodes: Secondary | ICD-10-CM | POA: Diagnosis not present

## 2017-05-18 DIAGNOSIS — R1312 Dysphagia, oropharyngeal phase: Secondary | ICD-10-CM | POA: Diagnosis not present

## 2017-05-20 DIAGNOSIS — J449 Chronic obstructive pulmonary disease, unspecified: Secondary | ICD-10-CM | POA: Diagnosis not present

## 2017-05-20 DIAGNOSIS — R627 Adult failure to thrive: Secondary | ICD-10-CM | POA: Diagnosis not present

## 2017-05-20 DIAGNOSIS — E43 Unspecified severe protein-calorie malnutrition: Secondary | ICD-10-CM | POA: Diagnosis not present

## 2017-05-20 DIAGNOSIS — R1312 Dysphagia, oropharyngeal phase: Secondary | ICD-10-CM | POA: Diagnosis not present

## 2017-05-20 DIAGNOSIS — C8292 Follicular lymphoma, unspecified, intrathoracic lymph nodes: Secondary | ICD-10-CM | POA: Diagnosis not present

## 2017-05-22 DIAGNOSIS — J449 Chronic obstructive pulmonary disease, unspecified: Secondary | ICD-10-CM | POA: Diagnosis not present

## 2017-05-22 DIAGNOSIS — R1312 Dysphagia, oropharyngeal phase: Secondary | ICD-10-CM | POA: Diagnosis not present

## 2017-05-22 DIAGNOSIS — R627 Adult failure to thrive: Secondary | ICD-10-CM | POA: Diagnosis not present

## 2017-05-22 DIAGNOSIS — E43 Unspecified severe protein-calorie malnutrition: Secondary | ICD-10-CM | POA: Diagnosis not present

## 2017-05-22 DIAGNOSIS — C8292 Follicular lymphoma, unspecified, intrathoracic lymph nodes: Secondary | ICD-10-CM | POA: Diagnosis not present

## 2017-05-25 DIAGNOSIS — R1312 Dysphagia, oropharyngeal phase: Secondary | ICD-10-CM | POA: Diagnosis not present

## 2017-05-25 DIAGNOSIS — R627 Adult failure to thrive: Secondary | ICD-10-CM | POA: Diagnosis not present

## 2017-05-25 DIAGNOSIS — C8292 Follicular lymphoma, unspecified, intrathoracic lymph nodes: Secondary | ICD-10-CM | POA: Diagnosis not present

## 2017-05-25 DIAGNOSIS — J449 Chronic obstructive pulmonary disease, unspecified: Secondary | ICD-10-CM | POA: Diagnosis not present

## 2017-05-25 DIAGNOSIS — E43 Unspecified severe protein-calorie malnutrition: Secondary | ICD-10-CM | POA: Diagnosis not present

## 2017-05-26 DIAGNOSIS — E43 Unspecified severe protein-calorie malnutrition: Secondary | ICD-10-CM | POA: Diagnosis not present

## 2017-05-26 DIAGNOSIS — C8292 Follicular lymphoma, unspecified, intrathoracic lymph nodes: Secondary | ICD-10-CM | POA: Diagnosis not present

## 2017-05-26 DIAGNOSIS — J449 Chronic obstructive pulmonary disease, unspecified: Secondary | ICD-10-CM | POA: Diagnosis not present

## 2017-05-26 DIAGNOSIS — R1312 Dysphagia, oropharyngeal phase: Secondary | ICD-10-CM | POA: Diagnosis not present

## 2017-05-26 DIAGNOSIS — R627 Adult failure to thrive: Secondary | ICD-10-CM | POA: Diagnosis not present

## 2017-05-27 DIAGNOSIS — R1312 Dysphagia, oropharyngeal phase: Secondary | ICD-10-CM | POA: Diagnosis not present

## 2017-05-27 DIAGNOSIS — J449 Chronic obstructive pulmonary disease, unspecified: Secondary | ICD-10-CM | POA: Diagnosis not present

## 2017-05-27 DIAGNOSIS — R627 Adult failure to thrive: Secondary | ICD-10-CM | POA: Diagnosis not present

## 2017-05-27 DIAGNOSIS — E43 Unspecified severe protein-calorie malnutrition: Secondary | ICD-10-CM | POA: Diagnosis not present

## 2017-05-27 DIAGNOSIS — C8292 Follicular lymphoma, unspecified, intrathoracic lymph nodes: Secondary | ICD-10-CM | POA: Diagnosis not present

## 2017-05-28 DIAGNOSIS — R627 Adult failure to thrive: Secondary | ICD-10-CM | POA: Diagnosis not present

## 2017-05-28 DIAGNOSIS — J449 Chronic obstructive pulmonary disease, unspecified: Secondary | ICD-10-CM | POA: Diagnosis not present

## 2017-05-28 DIAGNOSIS — R1312 Dysphagia, oropharyngeal phase: Secondary | ICD-10-CM | POA: Diagnosis not present

## 2017-05-28 DIAGNOSIS — C8292 Follicular lymphoma, unspecified, intrathoracic lymph nodes: Secondary | ICD-10-CM | POA: Diagnosis not present

## 2017-05-28 DIAGNOSIS — E43 Unspecified severe protein-calorie malnutrition: Secondary | ICD-10-CM | POA: Diagnosis not present

## 2017-05-29 DIAGNOSIS — C8292 Follicular lymphoma, unspecified, intrathoracic lymph nodes: Secondary | ICD-10-CM | POA: Diagnosis not present

## 2017-05-29 DIAGNOSIS — R627 Adult failure to thrive: Secondary | ICD-10-CM | POA: Diagnosis not present

## 2017-05-29 DIAGNOSIS — E43 Unspecified severe protein-calorie malnutrition: Secondary | ICD-10-CM | POA: Diagnosis not present

## 2017-05-29 DIAGNOSIS — R1312 Dysphagia, oropharyngeal phase: Secondary | ICD-10-CM | POA: Diagnosis not present

## 2017-05-29 DIAGNOSIS — J449 Chronic obstructive pulmonary disease, unspecified: Secondary | ICD-10-CM | POA: Diagnosis not present

## 2017-06-01 DIAGNOSIS — R1312 Dysphagia, oropharyngeal phase: Secondary | ICD-10-CM | POA: Diagnosis not present

## 2017-06-01 DIAGNOSIS — R627 Adult failure to thrive: Secondary | ICD-10-CM | POA: Diagnosis not present

## 2017-06-01 DIAGNOSIS — E43 Unspecified severe protein-calorie malnutrition: Secondary | ICD-10-CM | POA: Diagnosis not present

## 2017-06-01 DIAGNOSIS — J449 Chronic obstructive pulmonary disease, unspecified: Secondary | ICD-10-CM | POA: Diagnosis not present

## 2017-06-01 DIAGNOSIS — C8292 Follicular lymphoma, unspecified, intrathoracic lymph nodes: Secondary | ICD-10-CM | POA: Diagnosis not present

## 2017-06-03 DIAGNOSIS — E43 Unspecified severe protein-calorie malnutrition: Secondary | ICD-10-CM | POA: Diagnosis not present

## 2017-06-03 DIAGNOSIS — J449 Chronic obstructive pulmonary disease, unspecified: Secondary | ICD-10-CM | POA: Diagnosis not present

## 2017-06-03 DIAGNOSIS — R1312 Dysphagia, oropharyngeal phase: Secondary | ICD-10-CM | POA: Diagnosis not present

## 2017-06-03 DIAGNOSIS — R627 Adult failure to thrive: Secondary | ICD-10-CM | POA: Diagnosis not present

## 2017-06-03 DIAGNOSIS — C8292 Follicular lymphoma, unspecified, intrathoracic lymph nodes: Secondary | ICD-10-CM | POA: Diagnosis not present

## 2017-06-04 DIAGNOSIS — R1312 Dysphagia, oropharyngeal phase: Secondary | ICD-10-CM | POA: Diagnosis not present

## 2017-06-04 DIAGNOSIS — J449 Chronic obstructive pulmonary disease, unspecified: Secondary | ICD-10-CM | POA: Diagnosis not present

## 2017-06-04 DIAGNOSIS — R627 Adult failure to thrive: Secondary | ICD-10-CM | POA: Diagnosis not present

## 2017-06-04 DIAGNOSIS — E43 Unspecified severe protein-calorie malnutrition: Secondary | ICD-10-CM | POA: Diagnosis not present

## 2017-06-04 DIAGNOSIS — C8292 Follicular lymphoma, unspecified, intrathoracic lymph nodes: Secondary | ICD-10-CM | POA: Diagnosis not present

## 2017-06-05 DIAGNOSIS — J449 Chronic obstructive pulmonary disease, unspecified: Secondary | ICD-10-CM | POA: Diagnosis not present

## 2017-06-05 DIAGNOSIS — R627 Adult failure to thrive: Secondary | ICD-10-CM | POA: Diagnosis not present

## 2017-06-05 DIAGNOSIS — C8292 Follicular lymphoma, unspecified, intrathoracic lymph nodes: Secondary | ICD-10-CM | POA: Diagnosis not present

## 2017-06-05 DIAGNOSIS — E43 Unspecified severe protein-calorie malnutrition: Secondary | ICD-10-CM | POA: Diagnosis not present

## 2017-06-05 DIAGNOSIS — R1312 Dysphagia, oropharyngeal phase: Secondary | ICD-10-CM | POA: Diagnosis not present

## 2017-06-08 DIAGNOSIS — R1312 Dysphagia, oropharyngeal phase: Secondary | ICD-10-CM | POA: Diagnosis not present

## 2017-06-08 DIAGNOSIS — R627 Adult failure to thrive: Secondary | ICD-10-CM | POA: Diagnosis not present

## 2017-06-08 DIAGNOSIS — J449 Chronic obstructive pulmonary disease, unspecified: Secondary | ICD-10-CM | POA: Diagnosis not present

## 2017-06-08 DIAGNOSIS — C8292 Follicular lymphoma, unspecified, intrathoracic lymph nodes: Secondary | ICD-10-CM | POA: Diagnosis not present

## 2017-06-08 DIAGNOSIS — E43 Unspecified severe protein-calorie malnutrition: Secondary | ICD-10-CM | POA: Diagnosis not present

## 2017-06-09 DIAGNOSIS — E43 Unspecified severe protein-calorie malnutrition: Secondary | ICD-10-CM | POA: Diagnosis not present

## 2017-06-09 DIAGNOSIS — J449 Chronic obstructive pulmonary disease, unspecified: Secondary | ICD-10-CM | POA: Diagnosis not present

## 2017-06-09 DIAGNOSIS — R1312 Dysphagia, oropharyngeal phase: Secondary | ICD-10-CM | POA: Diagnosis not present

## 2017-06-09 DIAGNOSIS — C8292 Follicular lymphoma, unspecified, intrathoracic lymph nodes: Secondary | ICD-10-CM | POA: Diagnosis not present

## 2017-06-09 DIAGNOSIS — R627 Adult failure to thrive: Secondary | ICD-10-CM | POA: Diagnosis not present

## 2017-06-10 DIAGNOSIS — R627 Adult failure to thrive: Secondary | ICD-10-CM | POA: Diagnosis not present

## 2017-06-10 DIAGNOSIS — C8292 Follicular lymphoma, unspecified, intrathoracic lymph nodes: Secondary | ICD-10-CM | POA: Diagnosis not present

## 2017-06-10 DIAGNOSIS — E43 Unspecified severe protein-calorie malnutrition: Secondary | ICD-10-CM | POA: Diagnosis not present

## 2017-06-10 DIAGNOSIS — R1312 Dysphagia, oropharyngeal phase: Secondary | ICD-10-CM | POA: Diagnosis not present

## 2017-06-10 DIAGNOSIS — J449 Chronic obstructive pulmonary disease, unspecified: Secondary | ICD-10-CM | POA: Diagnosis not present

## 2017-06-12 DIAGNOSIS — E43 Unspecified severe protein-calorie malnutrition: Secondary | ICD-10-CM | POA: Diagnosis not present

## 2017-06-12 DIAGNOSIS — C8292 Follicular lymphoma, unspecified, intrathoracic lymph nodes: Secondary | ICD-10-CM | POA: Diagnosis not present

## 2017-06-12 DIAGNOSIS — J449 Chronic obstructive pulmonary disease, unspecified: Secondary | ICD-10-CM | POA: Diagnosis not present

## 2017-06-12 DIAGNOSIS — R627 Adult failure to thrive: Secondary | ICD-10-CM | POA: Diagnosis not present

## 2017-06-12 DIAGNOSIS — R1312 Dysphagia, oropharyngeal phase: Secondary | ICD-10-CM | POA: Diagnosis not present

## 2017-06-15 DIAGNOSIS — R1312 Dysphagia, oropharyngeal phase: Secondary | ICD-10-CM | POA: Diagnosis not present

## 2017-06-15 DIAGNOSIS — E43 Unspecified severe protein-calorie malnutrition: Secondary | ICD-10-CM | POA: Diagnosis not present

## 2017-06-15 DIAGNOSIS — C8292 Follicular lymphoma, unspecified, intrathoracic lymph nodes: Secondary | ICD-10-CM | POA: Diagnosis not present

## 2017-06-15 DIAGNOSIS — R627 Adult failure to thrive: Secondary | ICD-10-CM | POA: Diagnosis not present

## 2017-06-15 DIAGNOSIS — J449 Chronic obstructive pulmonary disease, unspecified: Secondary | ICD-10-CM | POA: Diagnosis not present

## 2017-06-17 DIAGNOSIS — J449 Chronic obstructive pulmonary disease, unspecified: Secondary | ICD-10-CM | POA: Diagnosis not present

## 2017-06-17 DIAGNOSIS — R627 Adult failure to thrive: Secondary | ICD-10-CM | POA: Diagnosis not present

## 2017-06-17 DIAGNOSIS — R1312 Dysphagia, oropharyngeal phase: Secondary | ICD-10-CM | POA: Diagnosis not present

## 2017-06-17 DIAGNOSIS — C8292 Follicular lymphoma, unspecified, intrathoracic lymph nodes: Secondary | ICD-10-CM | POA: Diagnosis not present

## 2017-06-17 DIAGNOSIS — E43 Unspecified severe protein-calorie malnutrition: Secondary | ICD-10-CM | POA: Diagnosis not present

## 2017-06-19 DIAGNOSIS — J449 Chronic obstructive pulmonary disease, unspecified: Secondary | ICD-10-CM | POA: Diagnosis not present

## 2017-06-19 DIAGNOSIS — R627 Adult failure to thrive: Secondary | ICD-10-CM | POA: Diagnosis not present

## 2017-06-19 DIAGNOSIS — C8292 Follicular lymphoma, unspecified, intrathoracic lymph nodes: Secondary | ICD-10-CM | POA: Diagnosis not present

## 2017-06-19 DIAGNOSIS — E43 Unspecified severe protein-calorie malnutrition: Secondary | ICD-10-CM | POA: Diagnosis not present

## 2017-06-19 DIAGNOSIS — R1312 Dysphagia, oropharyngeal phase: Secondary | ICD-10-CM | POA: Diagnosis not present

## 2017-06-22 DIAGNOSIS — J449 Chronic obstructive pulmonary disease, unspecified: Secondary | ICD-10-CM | POA: Diagnosis not present

## 2017-06-22 DIAGNOSIS — C8292 Follicular lymphoma, unspecified, intrathoracic lymph nodes: Secondary | ICD-10-CM | POA: Diagnosis not present

## 2017-06-22 DIAGNOSIS — R627 Adult failure to thrive: Secondary | ICD-10-CM | POA: Diagnosis not present

## 2017-06-22 DIAGNOSIS — E43 Unspecified severe protein-calorie malnutrition: Secondary | ICD-10-CM | POA: Diagnosis not present

## 2017-06-22 DIAGNOSIS — R1312 Dysphagia, oropharyngeal phase: Secondary | ICD-10-CM | POA: Diagnosis not present

## 2017-06-24 DIAGNOSIS — R627 Adult failure to thrive: Secondary | ICD-10-CM | POA: Diagnosis not present

## 2017-06-24 DIAGNOSIS — E43 Unspecified severe protein-calorie malnutrition: Secondary | ICD-10-CM | POA: Diagnosis not present

## 2017-06-24 DIAGNOSIS — R1312 Dysphagia, oropharyngeal phase: Secondary | ICD-10-CM | POA: Diagnosis not present

## 2017-06-24 DIAGNOSIS — J449 Chronic obstructive pulmonary disease, unspecified: Secondary | ICD-10-CM | POA: Diagnosis not present

## 2017-06-24 DIAGNOSIS — C8292 Follicular lymphoma, unspecified, intrathoracic lymph nodes: Secondary | ICD-10-CM | POA: Diagnosis not present

## 2017-06-25 DIAGNOSIS — R627 Adult failure to thrive: Secondary | ICD-10-CM | POA: Diagnosis not present

## 2017-06-25 DIAGNOSIS — J449 Chronic obstructive pulmonary disease, unspecified: Secondary | ICD-10-CM | POA: Diagnosis not present

## 2017-06-25 DIAGNOSIS — R1312 Dysphagia, oropharyngeal phase: Secondary | ICD-10-CM | POA: Diagnosis not present

## 2017-06-25 DIAGNOSIS — E43 Unspecified severe protein-calorie malnutrition: Secondary | ICD-10-CM | POA: Diagnosis not present

## 2017-06-25 DIAGNOSIS — C8292 Follicular lymphoma, unspecified, intrathoracic lymph nodes: Secondary | ICD-10-CM | POA: Diagnosis not present

## 2017-06-26 DIAGNOSIS — C8292 Follicular lymphoma, unspecified, intrathoracic lymph nodes: Secondary | ICD-10-CM | POA: Diagnosis not present

## 2017-06-26 DIAGNOSIS — E43 Unspecified severe protein-calorie malnutrition: Secondary | ICD-10-CM | POA: Diagnosis not present

## 2017-06-26 DIAGNOSIS — R1312 Dysphagia, oropharyngeal phase: Secondary | ICD-10-CM | POA: Diagnosis not present

## 2017-06-26 DIAGNOSIS — R627 Adult failure to thrive: Secondary | ICD-10-CM | POA: Diagnosis not present

## 2017-06-26 DIAGNOSIS — J449 Chronic obstructive pulmonary disease, unspecified: Secondary | ICD-10-CM | POA: Diagnosis not present

## 2017-06-29 DIAGNOSIS — E43 Unspecified severe protein-calorie malnutrition: Secondary | ICD-10-CM | POA: Diagnosis not present

## 2017-06-29 DIAGNOSIS — R1312 Dysphagia, oropharyngeal phase: Secondary | ICD-10-CM | POA: Diagnosis not present

## 2017-06-29 DIAGNOSIS — R627 Adult failure to thrive: Secondary | ICD-10-CM | POA: Diagnosis not present

## 2017-06-29 DIAGNOSIS — C8292 Follicular lymphoma, unspecified, intrathoracic lymph nodes: Secondary | ICD-10-CM | POA: Diagnosis not present

## 2017-06-29 DIAGNOSIS — J449 Chronic obstructive pulmonary disease, unspecified: Secondary | ICD-10-CM | POA: Diagnosis not present

## 2017-07-01 DIAGNOSIS — E43 Unspecified severe protein-calorie malnutrition: Secondary | ICD-10-CM | POA: Diagnosis not present

## 2017-07-01 DIAGNOSIS — J449 Chronic obstructive pulmonary disease, unspecified: Secondary | ICD-10-CM | POA: Diagnosis not present

## 2017-07-01 DIAGNOSIS — C8292 Follicular lymphoma, unspecified, intrathoracic lymph nodes: Secondary | ICD-10-CM | POA: Diagnosis not present

## 2017-07-01 DIAGNOSIS — R1312 Dysphagia, oropharyngeal phase: Secondary | ICD-10-CM | POA: Diagnosis not present

## 2017-07-01 DIAGNOSIS — R627 Adult failure to thrive: Secondary | ICD-10-CM | POA: Diagnosis not present

## 2017-07-03 DIAGNOSIS — E43 Unspecified severe protein-calorie malnutrition: Secondary | ICD-10-CM | POA: Diagnosis not present

## 2017-07-03 DIAGNOSIS — C8292 Follicular lymphoma, unspecified, intrathoracic lymph nodes: Secondary | ICD-10-CM | POA: Diagnosis not present

## 2017-07-03 DIAGNOSIS — R627 Adult failure to thrive: Secondary | ICD-10-CM | POA: Diagnosis not present

## 2017-07-03 DIAGNOSIS — R1312 Dysphagia, oropharyngeal phase: Secondary | ICD-10-CM | POA: Diagnosis not present

## 2017-07-03 DIAGNOSIS — J449 Chronic obstructive pulmonary disease, unspecified: Secondary | ICD-10-CM | POA: Diagnosis not present

## 2017-07-06 DIAGNOSIS — R627 Adult failure to thrive: Secondary | ICD-10-CM | POA: Diagnosis not present

## 2017-07-06 DIAGNOSIS — R1312 Dysphagia, oropharyngeal phase: Secondary | ICD-10-CM | POA: Diagnosis not present

## 2017-07-06 DIAGNOSIS — J449 Chronic obstructive pulmonary disease, unspecified: Secondary | ICD-10-CM | POA: Diagnosis not present

## 2017-07-06 DIAGNOSIS — C8292 Follicular lymphoma, unspecified, intrathoracic lymph nodes: Secondary | ICD-10-CM | POA: Diagnosis not present

## 2017-07-06 DIAGNOSIS — E43 Unspecified severe protein-calorie malnutrition: Secondary | ICD-10-CM | POA: Diagnosis not present

## 2017-07-07 DIAGNOSIS — M419 Scoliosis, unspecified: Secondary | ICD-10-CM | POA: Diagnosis not present

## 2017-07-07 DIAGNOSIS — M545 Low back pain: Secondary | ICD-10-CM | POA: Diagnosis not present

## 2017-07-07 DIAGNOSIS — R109 Unspecified abdominal pain: Secondary | ICD-10-CM | POA: Diagnosis not present

## 2017-07-07 DIAGNOSIS — S32000A Wedge compression fracture of unspecified lumbar vertebra, initial encounter for closed fracture: Secondary | ICD-10-CM | POA: Diagnosis not present

## 2017-07-08 DIAGNOSIS — R627 Adult failure to thrive: Secondary | ICD-10-CM | POA: Diagnosis not present

## 2017-07-08 DIAGNOSIS — E43 Unspecified severe protein-calorie malnutrition: Secondary | ICD-10-CM | POA: Diagnosis not present

## 2017-07-08 DIAGNOSIS — C8292 Follicular lymphoma, unspecified, intrathoracic lymph nodes: Secondary | ICD-10-CM | POA: Diagnosis not present

## 2017-07-08 DIAGNOSIS — J449 Chronic obstructive pulmonary disease, unspecified: Secondary | ICD-10-CM | POA: Diagnosis not present

## 2017-07-08 DIAGNOSIS — R1312 Dysphagia, oropharyngeal phase: Secondary | ICD-10-CM | POA: Diagnosis not present

## 2017-07-09 DIAGNOSIS — R627 Adult failure to thrive: Secondary | ICD-10-CM | POA: Diagnosis not present

## 2017-07-09 DIAGNOSIS — J449 Chronic obstructive pulmonary disease, unspecified: Secondary | ICD-10-CM | POA: Diagnosis not present

## 2017-07-09 DIAGNOSIS — E43 Unspecified severe protein-calorie malnutrition: Secondary | ICD-10-CM | POA: Diagnosis not present

## 2017-07-09 DIAGNOSIS — R1312 Dysphagia, oropharyngeal phase: Secondary | ICD-10-CM | POA: Diagnosis not present

## 2017-07-09 DIAGNOSIS — C8292 Follicular lymphoma, unspecified, intrathoracic lymph nodes: Secondary | ICD-10-CM | POA: Diagnosis not present

## 2017-07-10 DIAGNOSIS — R1312 Dysphagia, oropharyngeal phase: Secondary | ICD-10-CM | POA: Diagnosis not present

## 2017-07-10 DIAGNOSIS — C8292 Follicular lymphoma, unspecified, intrathoracic lymph nodes: Secondary | ICD-10-CM | POA: Diagnosis not present

## 2017-07-10 DIAGNOSIS — R627 Adult failure to thrive: Secondary | ICD-10-CM | POA: Diagnosis not present

## 2017-07-10 DIAGNOSIS — E43 Unspecified severe protein-calorie malnutrition: Secondary | ICD-10-CM | POA: Diagnosis not present

## 2017-07-10 DIAGNOSIS — J449 Chronic obstructive pulmonary disease, unspecified: Secondary | ICD-10-CM | POA: Diagnosis not present

## 2017-07-11 DIAGNOSIS — J449 Chronic obstructive pulmonary disease, unspecified: Secondary | ICD-10-CM | POA: Diagnosis not present

## 2017-07-11 DIAGNOSIS — R627 Adult failure to thrive: Secondary | ICD-10-CM | POA: Diagnosis not present

## 2017-07-11 DIAGNOSIS — E43 Unspecified severe protein-calorie malnutrition: Secondary | ICD-10-CM | POA: Diagnosis not present

## 2017-07-11 DIAGNOSIS — R1312 Dysphagia, oropharyngeal phase: Secondary | ICD-10-CM | POA: Diagnosis not present

## 2017-07-11 DIAGNOSIS — C8292 Follicular lymphoma, unspecified, intrathoracic lymph nodes: Secondary | ICD-10-CM | POA: Diagnosis not present

## 2017-07-13 DIAGNOSIS — C8292 Follicular lymphoma, unspecified, intrathoracic lymph nodes: Secondary | ICD-10-CM | POA: Diagnosis not present

## 2017-07-13 DIAGNOSIS — R1312 Dysphagia, oropharyngeal phase: Secondary | ICD-10-CM | POA: Diagnosis not present

## 2017-07-13 DIAGNOSIS — E43 Unspecified severe protein-calorie malnutrition: Secondary | ICD-10-CM | POA: Diagnosis not present

## 2017-07-13 DIAGNOSIS — J449 Chronic obstructive pulmonary disease, unspecified: Secondary | ICD-10-CM | POA: Diagnosis not present

## 2017-07-13 DIAGNOSIS — R627 Adult failure to thrive: Secondary | ICD-10-CM | POA: Diagnosis not present

## 2017-07-15 DIAGNOSIS — R1312 Dysphagia, oropharyngeal phase: Secondary | ICD-10-CM | POA: Diagnosis not present

## 2017-07-15 DIAGNOSIS — E43 Unspecified severe protein-calorie malnutrition: Secondary | ICD-10-CM | POA: Diagnosis not present

## 2017-07-15 DIAGNOSIS — J449 Chronic obstructive pulmonary disease, unspecified: Secondary | ICD-10-CM | POA: Diagnosis not present

## 2017-07-15 DIAGNOSIS — C8292 Follicular lymphoma, unspecified, intrathoracic lymph nodes: Secondary | ICD-10-CM | POA: Diagnosis not present

## 2017-07-15 DIAGNOSIS — R627 Adult failure to thrive: Secondary | ICD-10-CM | POA: Diagnosis not present

## 2017-07-16 DIAGNOSIS — C8132 Lymphocyte depleted classical Hodgkin lymphoma, intrathoracic lymph nodes: Secondary | ICD-10-CM | POA: Diagnosis not present

## 2017-07-16 DIAGNOSIS — Z0000 Encounter for general adult medical examination without abnormal findings: Secondary | ICD-10-CM | POA: Diagnosis not present

## 2017-07-16 DIAGNOSIS — J441 Chronic obstructive pulmonary disease with (acute) exacerbation: Secondary | ICD-10-CM | POA: Diagnosis not present

## 2017-07-16 DIAGNOSIS — I700 Atherosclerosis of aorta: Secondary | ICD-10-CM | POA: Diagnosis not present

## 2017-07-16 DIAGNOSIS — M545 Low back pain: Secondary | ICD-10-CM | POA: Diagnosis not present

## 2017-07-16 DIAGNOSIS — Z1389 Encounter for screening for other disorder: Secondary | ICD-10-CM | POA: Diagnosis not present

## 2017-07-16 DIAGNOSIS — I5041 Acute combined systolic (congestive) and diastolic (congestive) heart failure: Secondary | ICD-10-CM | POA: Diagnosis not present

## 2017-07-16 DIAGNOSIS — K210 Gastro-esophageal reflux disease with esophagitis: Secondary | ICD-10-CM | POA: Diagnosis not present

## 2017-07-16 DIAGNOSIS — F0280 Dementia in other diseases classified elsewhere without behavioral disturbance: Secondary | ICD-10-CM | POA: Diagnosis not present

## 2017-07-17 DIAGNOSIS — C8292 Follicular lymphoma, unspecified, intrathoracic lymph nodes: Secondary | ICD-10-CM | POA: Diagnosis not present

## 2017-07-17 DIAGNOSIS — E43 Unspecified severe protein-calorie malnutrition: Secondary | ICD-10-CM | POA: Diagnosis not present

## 2017-07-17 DIAGNOSIS — R1312 Dysphagia, oropharyngeal phase: Secondary | ICD-10-CM | POA: Diagnosis not present

## 2017-07-17 DIAGNOSIS — J449 Chronic obstructive pulmonary disease, unspecified: Secondary | ICD-10-CM | POA: Diagnosis not present

## 2017-07-17 DIAGNOSIS — R627 Adult failure to thrive: Secondary | ICD-10-CM | POA: Diagnosis not present

## 2017-07-22 DIAGNOSIS — J449 Chronic obstructive pulmonary disease, unspecified: Secondary | ICD-10-CM | POA: Diagnosis not present

## 2017-07-22 DIAGNOSIS — R1312 Dysphagia, oropharyngeal phase: Secondary | ICD-10-CM | POA: Diagnosis not present

## 2017-07-22 DIAGNOSIS — E43 Unspecified severe protein-calorie malnutrition: Secondary | ICD-10-CM | POA: Diagnosis not present

## 2017-07-22 DIAGNOSIS — R627 Adult failure to thrive: Secondary | ICD-10-CM | POA: Diagnosis not present

## 2017-07-22 DIAGNOSIS — C8292 Follicular lymphoma, unspecified, intrathoracic lymph nodes: Secondary | ICD-10-CM | POA: Diagnosis not present

## 2017-07-23 DIAGNOSIS — E43 Unspecified severe protein-calorie malnutrition: Secondary | ICD-10-CM | POA: Diagnosis not present

## 2017-07-23 DIAGNOSIS — R627 Adult failure to thrive: Secondary | ICD-10-CM | POA: Diagnosis not present

## 2017-07-23 DIAGNOSIS — R1312 Dysphagia, oropharyngeal phase: Secondary | ICD-10-CM | POA: Diagnosis not present

## 2017-07-23 DIAGNOSIS — C8292 Follicular lymphoma, unspecified, intrathoracic lymph nodes: Secondary | ICD-10-CM | POA: Diagnosis not present

## 2017-07-23 DIAGNOSIS — J449 Chronic obstructive pulmonary disease, unspecified: Secondary | ICD-10-CM | POA: Diagnosis not present

## 2017-07-24 DIAGNOSIS — R1312 Dysphagia, oropharyngeal phase: Secondary | ICD-10-CM | POA: Diagnosis not present

## 2017-07-24 DIAGNOSIS — J449 Chronic obstructive pulmonary disease, unspecified: Secondary | ICD-10-CM | POA: Diagnosis not present

## 2017-07-24 DIAGNOSIS — R627 Adult failure to thrive: Secondary | ICD-10-CM | POA: Diagnosis not present

## 2017-07-24 DIAGNOSIS — E43 Unspecified severe protein-calorie malnutrition: Secondary | ICD-10-CM | POA: Diagnosis not present

## 2017-07-24 DIAGNOSIS — C8292 Follicular lymphoma, unspecified, intrathoracic lymph nodes: Secondary | ICD-10-CM | POA: Diagnosis not present

## 2017-07-25 DIAGNOSIS — J449 Chronic obstructive pulmonary disease, unspecified: Secondary | ICD-10-CM | POA: Diagnosis not present

## 2017-07-25 DIAGNOSIS — C8292 Follicular lymphoma, unspecified, intrathoracic lymph nodes: Secondary | ICD-10-CM | POA: Diagnosis not present

## 2017-07-25 DIAGNOSIS — R627 Adult failure to thrive: Secondary | ICD-10-CM | POA: Diagnosis not present

## 2017-07-25 DIAGNOSIS — E43 Unspecified severe protein-calorie malnutrition: Secondary | ICD-10-CM | POA: Diagnosis not present

## 2017-07-25 DIAGNOSIS — R1312 Dysphagia, oropharyngeal phase: Secondary | ICD-10-CM | POA: Diagnosis not present

## 2017-07-27 DIAGNOSIS — R1312 Dysphagia, oropharyngeal phase: Secondary | ICD-10-CM | POA: Diagnosis not present

## 2017-07-27 DIAGNOSIS — J449 Chronic obstructive pulmonary disease, unspecified: Secondary | ICD-10-CM | POA: Diagnosis not present

## 2017-07-27 DIAGNOSIS — E43 Unspecified severe protein-calorie malnutrition: Secondary | ICD-10-CM | POA: Diagnosis not present

## 2017-07-27 DIAGNOSIS — R627 Adult failure to thrive: Secondary | ICD-10-CM | POA: Diagnosis not present

## 2017-07-27 DIAGNOSIS — C8292 Follicular lymphoma, unspecified, intrathoracic lymph nodes: Secondary | ICD-10-CM | POA: Diagnosis not present

## 2017-09-17 DIAGNOSIS — M79671 Pain in right foot: Secondary | ICD-10-CM | POA: Diagnosis not present

## 2017-09-17 DIAGNOSIS — G600 Hereditary motor and sensory neuropathy: Secondary | ICD-10-CM | POA: Diagnosis not present

## 2017-09-17 DIAGNOSIS — M79672 Pain in left foot: Secondary | ICD-10-CM | POA: Diagnosis not present

## 2017-09-17 DIAGNOSIS — I739 Peripheral vascular disease, unspecified: Secondary | ICD-10-CM | POA: Diagnosis not present

## 2017-10-15 DIAGNOSIS — J449 Chronic obstructive pulmonary disease, unspecified: Secondary | ICD-10-CM | POA: Diagnosis not present

## 2017-10-15 DIAGNOSIS — E440 Moderate protein-calorie malnutrition: Secondary | ICD-10-CM | POA: Diagnosis not present

## 2017-10-15 DIAGNOSIS — M545 Low back pain: Secondary | ICD-10-CM | POA: Diagnosis not present

## 2018-01-18 DIAGNOSIS — K210 Gastro-esophageal reflux disease with esophagitis: Secondary | ICD-10-CM | POA: Diagnosis not present

## 2018-01-18 DIAGNOSIS — J449 Chronic obstructive pulmonary disease, unspecified: Secondary | ICD-10-CM | POA: Diagnosis not present

## 2018-01-18 DIAGNOSIS — E440 Moderate protein-calorie malnutrition: Secondary | ICD-10-CM | POA: Diagnosis not present

## 2018-01-18 DIAGNOSIS — M545 Low back pain: Secondary | ICD-10-CM | POA: Diagnosis not present

## 2018-02-01 DIAGNOSIS — S022XXA Fracture of nasal bones, initial encounter for closed fracture: Secondary | ICD-10-CM | POA: Diagnosis not present

## 2018-02-01 DIAGNOSIS — E43 Unspecified severe protein-calorie malnutrition: Secondary | ICD-10-CM | POA: Diagnosis present

## 2018-02-01 DIAGNOSIS — E875 Hyperkalemia: Secondary | ICD-10-CM | POA: Diagnosis present

## 2018-02-01 DIAGNOSIS — J984 Other disorders of lung: Secondary | ICD-10-CM | POA: Diagnosis not present

## 2018-02-01 DIAGNOSIS — M25551 Pain in right hip: Secondary | ICD-10-CM | POA: Diagnosis not present

## 2018-02-01 DIAGNOSIS — Z9889 Other specified postprocedural states: Secondary | ICD-10-CM | POA: Diagnosis not present

## 2018-02-01 DIAGNOSIS — Z9181 History of falling: Secondary | ICD-10-CM | POA: Diagnosis not present

## 2018-02-01 DIAGNOSIS — R0682 Tachypnea, not elsewhere classified: Secondary | ICD-10-CM | POA: Diagnosis not present

## 2018-02-01 DIAGNOSIS — M25552 Pain in left hip: Secondary | ICD-10-CM | POA: Diagnosis not present

## 2018-02-01 DIAGNOSIS — F0390 Unspecified dementia without behavioral disturbance: Secondary | ICD-10-CM | POA: Diagnosis present

## 2018-02-01 DIAGNOSIS — S0083XA Contusion of other part of head, initial encounter: Secondary | ICD-10-CM | POA: Diagnosis present

## 2018-02-01 DIAGNOSIS — F339 Major depressive disorder, recurrent, unspecified: Secondary | ICD-10-CM | POA: Diagnosis not present

## 2018-02-01 DIAGNOSIS — C8590 Non-Hodgkin lymphoma, unspecified, unspecified site: Secondary | ICD-10-CM | POA: Diagnosis not present

## 2018-02-01 DIAGNOSIS — Z8673 Personal history of transient ischemic attack (TIA), and cerebral infarction without residual deficits: Secondary | ICD-10-CM | POA: Diagnosis not present

## 2018-02-01 DIAGNOSIS — Z681 Body mass index (BMI) 19 or less, adult: Secondary | ICD-10-CM | POA: Diagnosis not present

## 2018-02-01 DIAGNOSIS — J449 Chronic obstructive pulmonary disease, unspecified: Secondary | ICD-10-CM | POA: Diagnosis present

## 2018-02-01 DIAGNOSIS — Z791 Long term (current) use of non-steroidal anti-inflammatories (NSAID): Secondary | ICD-10-CM | POA: Diagnosis not present

## 2018-02-01 DIAGNOSIS — F419 Anxiety disorder, unspecified: Secondary | ICD-10-CM | POA: Diagnosis not present

## 2018-02-01 DIAGNOSIS — S299XXA Unspecified injury of thorax, initial encounter: Secondary | ICD-10-CM | POA: Diagnosis not present

## 2018-02-01 DIAGNOSIS — R404 Transient alteration of awareness: Secondary | ICD-10-CM | POA: Diagnosis not present

## 2018-02-01 DIAGNOSIS — R410 Disorientation, unspecified: Secondary | ICD-10-CM | POA: Diagnosis not present

## 2018-02-01 DIAGNOSIS — Z79891 Long term (current) use of opiate analgesic: Secondary | ICD-10-CM | POA: Diagnosis not present

## 2018-02-01 DIAGNOSIS — S79912A Unspecified injury of left hip, initial encounter: Secondary | ICD-10-CM | POA: Diagnosis not present

## 2018-02-01 DIAGNOSIS — M1990 Unspecified osteoarthritis, unspecified site: Secondary | ICD-10-CM | POA: Diagnosis present

## 2018-02-01 DIAGNOSIS — G629 Polyneuropathy, unspecified: Secondary | ICD-10-CM | POA: Diagnosis present

## 2018-02-01 DIAGNOSIS — S1980XA Other specified injuries of unspecified part of neck, initial encounter: Secondary | ICD-10-CM | POA: Diagnosis not present

## 2018-02-01 DIAGNOSIS — Z7401 Bed confinement status: Secondary | ICD-10-CM | POA: Diagnosis not present

## 2018-02-01 DIAGNOSIS — I252 Old myocardial infarction: Secondary | ICD-10-CM | POA: Diagnosis not present

## 2018-02-01 DIAGNOSIS — Z8572 Personal history of non-Hodgkin lymphomas: Secondary | ICD-10-CM | POA: Diagnosis not present

## 2018-02-01 DIAGNOSIS — E860 Dehydration: Secondary | ICD-10-CM | POA: Diagnosis present

## 2018-02-01 DIAGNOSIS — S79911A Unspecified injury of right hip, initial encounter: Secondary | ICD-10-CM | POA: Diagnosis not present

## 2018-02-01 DIAGNOSIS — R531 Weakness: Secondary | ICD-10-CM | POA: Diagnosis present

## 2018-02-01 DIAGNOSIS — Z9221 Personal history of antineoplastic chemotherapy: Secondary | ICD-10-CM | POA: Diagnosis not present

## 2018-02-01 DIAGNOSIS — S0093XA Contusion of unspecified part of head, initial encounter: Secondary | ICD-10-CM | POA: Diagnosis not present

## 2018-02-01 DIAGNOSIS — M6281 Muscle weakness (generalized): Secondary | ICD-10-CM | POA: Diagnosis not present

## 2018-02-01 DIAGNOSIS — I2510 Atherosclerotic heart disease of native coronary artery without angina pectoris: Secondary | ICD-10-CM | POA: Diagnosis present

## 2018-02-01 DIAGNOSIS — Z79899 Other long term (current) drug therapy: Secondary | ICD-10-CM | POA: Diagnosis not present

## 2018-02-01 DIAGNOSIS — G309 Alzheimer's disease, unspecified: Secondary | ICD-10-CM | POA: Diagnosis not present

## 2018-02-01 DIAGNOSIS — F0280 Dementia in other diseases classified elsewhere without behavioral disturbance: Secondary | ICD-10-CM | POA: Diagnosis not present

## 2018-02-01 DIAGNOSIS — R296 Repeated falls: Secondary | ICD-10-CM | POA: Diagnosis present

## 2018-02-01 DIAGNOSIS — R4182 Altered mental status, unspecified: Secondary | ICD-10-CM | POA: Diagnosis not present

## 2018-02-01 DIAGNOSIS — K219 Gastro-esophageal reflux disease without esophagitis: Secondary | ICD-10-CM | POA: Diagnosis present

## 2018-02-01 DIAGNOSIS — F17220 Nicotine dependence, chewing tobacco, uncomplicated: Secondary | ICD-10-CM | POA: Diagnosis present

## 2018-02-01 DIAGNOSIS — N400 Enlarged prostate without lower urinary tract symptoms: Secondary | ICD-10-CM | POA: Diagnosis present

## 2018-02-01 DIAGNOSIS — R29898 Other symptoms and signs involving the musculoskeletal system: Secondary | ICD-10-CM | POA: Diagnosis not present

## 2018-02-01 DIAGNOSIS — W19XXXA Unspecified fall, initial encounter: Secondary | ICD-10-CM | POA: Diagnosis not present

## 2018-02-01 DIAGNOSIS — S3993XA Unspecified injury of pelvis, initial encounter: Secondary | ICD-10-CM | POA: Diagnosis not present

## 2018-02-01 DIAGNOSIS — Z87891 Personal history of nicotine dependence: Secondary | ICD-10-CM | POA: Diagnosis not present

## 2018-02-01 DIAGNOSIS — R630 Anorexia: Secondary | ICD-10-CM | POA: Diagnosis present

## 2018-02-01 DIAGNOSIS — S3991XA Unspecified injury of abdomen, initial encounter: Secondary | ICD-10-CM | POA: Diagnosis not present

## 2018-02-08 DIAGNOSIS — C8590 Non-Hodgkin lymphoma, unspecified, unspecified site: Secondary | ICD-10-CM | POA: Diagnosis not present

## 2018-02-08 DIAGNOSIS — F419 Anxiety disorder, unspecified: Secondary | ICD-10-CM | POA: Diagnosis not present

## 2018-02-08 DIAGNOSIS — S0083XA Contusion of other part of head, initial encounter: Secondary | ICD-10-CM | POA: Diagnosis not present

## 2018-02-08 DIAGNOSIS — I2510 Atherosclerotic heart disease of native coronary artery without angina pectoris: Secondary | ICD-10-CM | POA: Diagnosis not present

## 2018-02-08 DIAGNOSIS — Z681 Body mass index (BMI) 19 or less, adult: Secondary | ICD-10-CM | POA: Diagnosis not present

## 2018-02-08 DIAGNOSIS — R410 Disorientation, unspecified: Secondary | ICD-10-CM | POA: Diagnosis not present

## 2018-02-08 DIAGNOSIS — K210 Gastro-esophageal reflux disease with esophagitis: Secondary | ICD-10-CM | POA: Diagnosis not present

## 2018-02-08 DIAGNOSIS — E875 Hyperkalemia: Secondary | ICD-10-CM | POA: Diagnosis not present

## 2018-02-08 DIAGNOSIS — R404 Transient alteration of awareness: Secondary | ICD-10-CM | POA: Diagnosis not present

## 2018-02-08 DIAGNOSIS — Z8673 Personal history of transient ischemic attack (TIA), and cerebral infarction without residual deficits: Secondary | ICD-10-CM | POA: Diagnosis not present

## 2018-02-08 DIAGNOSIS — M6281 Muscle weakness (generalized): Secondary | ICD-10-CM | POA: Diagnosis not present

## 2018-02-08 DIAGNOSIS — K219 Gastro-esophageal reflux disease without esophagitis: Secondary | ICD-10-CM | POA: Diagnosis not present

## 2018-02-08 DIAGNOSIS — J40 Bronchitis, not specified as acute or chronic: Secondary | ICD-10-CM | POA: Diagnosis not present

## 2018-02-08 DIAGNOSIS — Z7401 Bed confinement status: Secondary | ICD-10-CM | POA: Diagnosis not present

## 2018-02-08 DIAGNOSIS — R29898 Other symptoms and signs involving the musculoskeletal system: Secondary | ICD-10-CM | POA: Diagnosis not present

## 2018-02-08 DIAGNOSIS — F339 Major depressive disorder, recurrent, unspecified: Secondary | ICD-10-CM | POA: Diagnosis not present

## 2018-02-08 DIAGNOSIS — R296 Repeated falls: Secondary | ICD-10-CM | POA: Diagnosis not present

## 2018-02-08 DIAGNOSIS — E860 Dehydration: Secondary | ICD-10-CM | POA: Diagnosis not present

## 2018-02-08 DIAGNOSIS — E43 Unspecified severe protein-calorie malnutrition: Secondary | ICD-10-CM | POA: Diagnosis not present

## 2018-02-08 DIAGNOSIS — R630 Anorexia: Secondary | ICD-10-CM | POA: Diagnosis not present

## 2018-02-08 DIAGNOSIS — N400 Enlarged prostate without lower urinary tract symptoms: Secondary | ICD-10-CM | POA: Diagnosis not present

## 2018-02-08 DIAGNOSIS — R531 Weakness: Secondary | ICD-10-CM | POA: Diagnosis not present

## 2018-02-09 DIAGNOSIS — K210 Gastro-esophageal reflux disease with esophagitis: Secondary | ICD-10-CM | POA: Diagnosis not present

## 2018-02-09 DIAGNOSIS — J40 Bronchitis, not specified as acute or chronic: Secondary | ICD-10-CM | POA: Diagnosis not present

## 2018-02-09 DIAGNOSIS — N400 Enlarged prostate without lower urinary tract symptoms: Secondary | ICD-10-CM | POA: Diagnosis not present

## 2018-02-15 ENCOUNTER — Other Ambulatory Visit: Payer: Self-pay | Admitting: *Deleted

## 2018-02-15 NOTE — Patient Outreach (Signed)
[facility]) Care Management  02/15/2018  Dennis Cummings Sr. 07-29-1929 [id number]  Onsite visit to [facility]. Patient was sound asleep, did not awaken. Met with Roanna Epley, she reports patient is from [place] and will be LTC in a [place] facility as Medicaid will not cover LTC in [place] facilities.   As patient will be LTC no [facility] care management needs identified at this time.  Will sign off after collaborating with [facility].  Royetta Crochet. Laymond Purser, RN, BSN, [place] [phone]) Business Cell  [phone]) Toll Free Office

## 2018-02-24 DEATH — deceased

## 2018-03-27 DEATH — deceased
